# Patient Record
Sex: Female | Born: 1975
Health system: Southern US, Community
[De-identification: ages and names within clinical notes are randomized; demographics above are authoritative.]

## PROBLEM LIST (undated history)

## (undated) ENCOUNTER — Inpatient Hospital Stay (HOSPITAL_COMMUNITY): Payer: Self-pay

## (undated) DIAGNOSIS — O09899 Supervision of other high risk pregnancies, unspecified trimester: Secondary | ICD-10-CM

## (undated) DIAGNOSIS — O24419 Gestational diabetes mellitus in pregnancy, unspecified control: Secondary | ICD-10-CM

## (undated) DIAGNOSIS — Z86718 Personal history of other venous thrombosis and embolism: Principal | ICD-10-CM

## (undated) DIAGNOSIS — D649 Anemia, unspecified: Secondary | ICD-10-CM

## (undated) HISTORY — DX: Gestational diabetes mellitus in pregnancy, unspecified control: O24.419

## (undated) HISTORY — DX: Supervision of other high risk pregnancies, unspecified trimester: O09.899

## (undated) HISTORY — PX: SALPINGOSTOMY: SHX2372

## (undated) HISTORY — DX: Personal history of other venous thrombosis and embolism: Z86.718

---

## 2010-05-12 NOTE — L&D Delivery Note (Signed)
Delivery Note At 6:59 AM a viable female was delivered via Vaginal, Spontaneous Delivery (Presentation: occiput anterior).  APGAR: 9, 9; weight 6 lb 0.3 oz (2730 g).   Placenta status: Intact, Spontaneous.  Cord: 3 vessels with the following complications: Short.    Anesthesia: None  Episiotomy: None Lacerations: None Est. Blood Loss (mL):  Mom to postpartum.  Baby to nursery-stable.  STINSON, JACOB JEHIEL 05/12/2011, 7:30 AM

## 2010-12-22 LAB — RUBELLA ANTIBODY, IGM: Rubella: IMMUNE

## 2010-12-23 LAB — ABO/RH: RH Type: POSITIVE

## 2010-12-23 LAB — ANTIBODY SCREEN: Antibody Screen: NEGATIVE

## 2011-01-03 LAB — US OB DETAIL + 14 WK

## 2011-02-10 ENCOUNTER — Telehealth: Payer: Self-pay | Admitting: Family Medicine

## 2011-02-10 ENCOUNTER — Ambulatory Visit (INDEPENDENT_AMBULATORY_CARE_PROVIDER_SITE_OTHER): Payer: Self-pay | Admitting: Family Medicine

## 2011-02-10 ENCOUNTER — Other Ambulatory Visit: Payer: Self-pay | Admitting: Obstetrics & Gynecology

## 2011-02-10 VITALS — BP 114/72 | Temp 97.1°F | Ht 62.0 in | Wt 137.1 lb

## 2011-02-10 DIAGNOSIS — O0990 Supervision of high risk pregnancy, unspecified, unspecified trimester: Secondary | ICD-10-CM

## 2011-02-10 DIAGNOSIS — Z86718 Personal history of other venous thrombosis and embolism: Secondary | ICD-10-CM

## 2011-02-10 DIAGNOSIS — O09299 Supervision of pregnancy with other poor reproductive or obstetric history, unspecified trimester: Secondary | ICD-10-CM

## 2011-02-10 DIAGNOSIS — O09529 Supervision of elderly multigravida, unspecified trimester: Secondary | ICD-10-CM

## 2011-02-10 DIAGNOSIS — O0940 Supervision of pregnancy with grand multiparity, unspecified trimester: Secondary | ICD-10-CM

## 2011-02-10 LAB — CBC
MCH: 32.1 pg (ref 26.0–34.0)
Platelets: 291 10*3/uL (ref 150–400)
RBC: 3.61 MIL/uL — ABNORMAL LOW (ref 3.87–5.11)
RDW: 15 % (ref 11.5–15.5)
WBC: 9 10*3/uL (ref 4.0–10.5)

## 2011-02-10 NOTE — Progress Notes (Signed)
Subjective:    Jimia Gentles is a 35 y.o. female being seen today for her obstetrical visit. She is at [redacted]w[redacted]d gestation. Patient reports no complaints. Fetal movement: normal. Patient was referred from [facility], where she initiated her prenatal care for this pregnancy due to financial concerns; she has no intention of adopting out her baby. She has a history of DVT of her LT LE with her most recent pregnancy 4 years ago. She received her lovenox from [facility] for that pregnancy; it was diagnosed at 3 months gestation approximately; she didn't know she was pregnant until she got the DVT and sought medical care for a swollen leg. She had many blood tests and a pelvic exam at the [facility] clinic. She was told her pap and pelvic exam, as well as her other labs were normal. She feels like she has a slight cold today and wishes to wait until next visit to get her Flu vaccine.   Menstrual History: OB History    Grav Para Term Preterm Abortions TAB SAB Ect Mult Living   6 5 5  0 0 0 0 0 0 5      No LMP recorded. Patient is pregnant.    Objective:    BP 114/72  Temp 97.1 F (36.2 C)  Ht 5\' 2"  (1.575 m)  Wt 62.188 kg (137 lb 1.6 oz)  BMI 25.08 kg/m2 FHT: 136 BPM  Uterine Size: size equals dates at 26 cm     Assessment:    Pregnancy 26 and 5/7 weeks  History of DVT with pregnancy, has been off Lovenox x 2-3 weeks, needs to restart this  Plan:  Glucola, CBC, RPR and hemoglobin electrophoresis today ROI signed today and will get records from [facility] Contacted and left a message for Cole regarding need to assist patient for Medicaid, if eligible.  I have contacted Care Management at [facility]. Patient is not eligible for Lovenox financial assistance program because she does not have a Tree surgeon number. She is unlikely to qualify for pregnancy medicaid that would cover the expense for an outpatient medication. To get heparin, a cheaper option as a medication, she would have to also  purchase syringes and needles, and would have to heparin levels drawn approximately each week, and her dose would increase as her pregnancy progresses; she may need TID dosing to reach an adequate level (I spoke to clinical pharmacist at [facility] regarding this). Heparin is on national backorder, and I was not able to locate a pharmacy that could provide it; [facility] has it but states it would cost $228.00 for a box of 25 vials that would each provide 8 doses of 5000 units; her minimal dose would be 5000 units BID. This does not include the cost for the syringes and needles. Tresa Endo Rassett spoke to risk management, and patient would likely qualify for assistance through [facility] that would "write off" the cost of the labs to monitor the heparin level. The patient still has concerns about the cost of the medication, lovenox or heparin (see phone call encounter). The patient voices understanding of the risks to her and the pregnancy without this medication. She is willing to do what is needed to get this medication but cannot afford it. She is very thankful for our efforts. Clinical staff are assisting with research with the patient's records from [facility] and speaking to [facility] about any programs the patient might qualify for to get the medication paid for.  Follow up in 3 weeks for routine prenatal care otherwise.

## 2011-02-10 NOTE — Telephone Encounter (Signed)
Made a phone call to the patient with Interpretor Albertina Senegal. Pt was seen by me earlier in clinic today (see note). Pt does not qualify for Lovenox financial assistance b/c she does not have a SSN and this is required for assistance. She states she had received medications through [facility] during her pregnancy 4 years ago when she had the clot. She has gotten some medication through them and was told she could get one more refill, but has not went to get it. She filled some paperwork out with a friend who helped interpret at [facility] for assistance, but is not sure what it was for. She thinks she should be able to the medication for $4 after she gets her medicaid. She nor her husband have a job at this time. She voices understanding about the danger of not taking lovenox or heparin during her pregnancy and she would take it but cannot afford it. I am having clinical staff research possible other programs that might be available to her through [place] or [facility]. We will contact her and let her know if anything we can refer her to. Pt states she has a refill available for the lovenox, she will attempt to go to [facility] to get it, but she is unsure because of the travel costs.

## 2011-02-10 NOTE — Progress Notes (Signed)
Pt had PNC at [facility] Dept, ROI sent. She had labs and hollisters and pap. Pt is supposed to be taking Lovenox but has not taken it in 2 weeks. Lovenox prescribed at [facility], ROI signed and faxed. She says that she takes Lovenox for clots, in her last pregnany she had a clot.  Pt states that she feels very fatigued. Pt does not want to do flu shot today not feeling well. Will ask again at next visit.  1hr gtt today due at 1120

## 2011-02-10 NOTE — Patient Instructions (Signed)
Embarazo - Segundo trimestre (Pregnancy - Second Trimester) El segundo trimestre del embarazo (del 3 al 6mes) es un perodo de evolucin rpida para usted y el beb. Hacia el final del sexto mes, el beb mide aproximadamente 23 cm y pesa 680 g. Comenzar a sentir los movimientos del beb entre las 18 y las 20 semanas de embarazo. Podr sentir las pataditas ("quickening en ingls"). Hay un rpido aumento de peso. Puede segregar un lquido claro (calostro) de las mamas. Quizs sienta pequeas contracciones en el vientre (tero) Esto se conoce como falso trabajo de parto o contracciones de Braxton-Hicks. Es como una prctica del trabajo de parto que se produce cuando el beb est listo para salir. Generalmente los problemas de vmitos matinales ya se han superado hacia el final del primer trimestre. Algunas mujeres desarrollan pequeas manchas oscuras (que se denominan cloasma, mscara del embarazo) en la cara que normalmente se van luego del nacimiento del beb. La exposicin al sol empeora las manchas. Puede desarrollarse acn en algunas mujeres embarazadas, y puede desaparecer en aquellas que ya tienen acn. EXAMENES PRENATALES  Durante los exmenes prenatales, deber seguir realizando pruebas de sangre, segn avance el embarazo. Estas pruebas se realizan para controlar su salud y la del beb. Tambin se realizan anlisis de sangre para conocer los niveles de hemoglobina. La anemia (bajo nivel de hemoglobina) es frecuente durante el embarazo. Para prevenirla, se administran hierro y vitaminas. Tambin se le realizarn exmenes para saber si tiene diabetes entre las 24 y las 28 semanas del embarazo. Podrn repetirle algunas de las pruebas que le hicieron previamente.  En cada visita le medirn el tamao del tero. Esto se realiza para asegurarse de que el beb est creciendo correctamente de acuerdo al estado del embarazo.  Tambin en cada visita prenatal controlarn su presin arterial. Esto se realiza  para asegurarse de que no tenga toxemia.  Se controlar su orina para asegurarse de que no tenga infecciones, diabetes o protena en la orina.  Se controlar su peso regularmente para asegurarse que el aumento ocurre al ritmo indicado. Esto se hace para asegurarse que usted y el beb tienen una evolucin normal.  En algunas ocasiones se realiza una prueba de ultrasonido para confirmar el correcto desarrollo y evolucin del beb. Esta prueba se realiza con ondas sonoras inofensivas para el beb, de modo que el profesional pueda calcular ms precisamente la fecha del parto. Algunas veces se realizan pruebas especializadas del lquido amnitico que rodea al beb. Esta prueba se denomina amniocentesis. El lquido amnitico se obtiene introduciendo una aguja en el vientre (abdomen). Se realiza para controlar los cromosomas en aquellos casos en los que existe alguna preocupacin acerca de algn problema gentico que pueda sufrir el beb. En ocasiones se lleva a cabo cerca del final del embarazo, si es necesario inducir al parto. En este caso se realiza para asegurarse que los pulmones del beb estn lo suficientemente maduros como para que pueda vivir fuera del tero. CAMBIOS QUE OCURREN EN EL SEGUNDO TRIMESTRE DEL EMBARAZO Su organismo atravesar numerosos cambios durante el embarazo. Estos pueden variar de una persona a otra. Converse con el profesional que la asiste acerca los cambios que usted note y que la preocupen.  Durante el segundo trimestre probablemente sienta un aumento del apetito. Es normal tener "antojos" de ciertas comidas. Esto vara de una persona a otra y de un embarazo a otro.  El abdomen inferior comenzar a abultarse.  Podr tener la necesidad de orinar con ms frecuencia debido a que   el tero y el beb presionan sobre la vejiga. Tambin es frecuente contraer ms infecciones urinarias durante el embarazo (dolor al orinar). Puede evitarlas bebiendo gran cantidad de lquidos y vaciando  la vejiga antes y despus de mantener relaciones sexuales.  Podrn aparecer las primeras estras en las caderas, abdomen y mamas. Estos son cambios normales del cuerpo durante el embarazo. No existen medicamentos ni ejercicios que puedan prevenir estos cambios.  Es posible que comience a desarrollar venas inflamadas y abultadas (varices) en las piernas. El uso de medias de descanso, elevar sus pies durante 15 minutos, 3 a 4 veces al da y limitar la sal en su dieta ayuda a aliviar el problema.  Podr sentir acidez gstrica a medida que el tero crece y presiona contra el estmago. Puede tomar anticidos, con la autorizacin de su mdico, para aliviar este problema. Tambin es til ingerir pequeas comidas 4 a 5 veces al da.  La constipacin puede tratarse con un laxante o agregando fibra a su dieta. Beber grandes cantidades de lquidos, comer vegetales, frutas y granos integrales es de gran ayuda.  Tambin es beneficioso practicar actividad fsica. Si ha sido una persona activa hasta el embarazo, podr continuar con la mayora de las actividades durante el mismo. Si ha sido menos activa, puede ser beneficioso que comience con un programa de ejercicios, como realizar caminatas.  Puede desarrollar hemorroides (vrices en el recto) hacia el final del segundo trimestre. Tomar baos de asiento tibios y utilizar cremas recomendadas por el profesional que lo asiste sern de ayuda para los problemas de hemorroides.  Tambin podr sentir dolor de espalda durante este momento de su embarazo. Evite levantar objetos pesados, utilice zapatos de taco bajo y mantenga una buena postura para ayudar a reducir los problemas de espalda.  Algunas mujeres embarazadas desarrollan hormigueo y adormecimiento de la mano y los dedos debido a la hinchazn y compresin de los ligamentos de la mueca (sndrome del tnel carpiano). Esto desaparece una vez que el beb nace.  Como sus pechos se agrandan, necesitar un sujetador  ms grande. Use un sostn de soporte, cmodo y de algodn. No utilice un sostn para amamantar hasta el ltimo mes de embarazo si va a amamantar al beb.  Podr observar una lnea oscura desde el ombligo hacia la zona pbica denominada linea nigra.  Podr observar que sus mejillas se ponen coloradas debido al aumento de flujo sanguneo en la cara.  Podr desarrollar "araitas" en la cara, cuello y pecho. Esto desaparece una vez que el beb nace. INSTRUCCIONES PARA EL CUIDADO DOMICILIARIO  Es extremadamente importante que evite el cigarrillo, hierbas medicinales, alcohol y las drogas no prescriptas durante el embarazo. Estas sustancias qumicas afectan la formacin y el desarrollo del beb. Evite estas sustancias durante todo el embarazo para asegurar el nacimiento de un beb sano.  La mayor parte de los cuidados que se aconsejan son los mismos que los indicados para el primer trimestre del embarazo. Cumpla con las citas tal como se le indic. Siga las instrucciones del profesional que lo asiste con respecto al uso de los medicamentos, el ejercicio y la dieta.  Durante el embarazo debe obtener nutrientes para usted y para su beb. Consuma alimentos balanceados a intervalos regulares. Elija alimentos como carne, pescado, leche y otros productos lcteos descremados, vegetales, frutas, panes integrales y cereales. El profesional le informar cul es el aumento de peso ideal.  Las relaciones sexuales fsicas pueden continuarse hasta cerca del fin del embarazo si no existen otros problemas. Estos   problemas pueden ser la prdida temprana (prematura) de lquido amnitico de las membranas, sangrado vaginal, dolor abdominal u otros problemas mdicos o del embarazo.  Realice actividad fsica todos los das, si no tiene restricciones. Consulte con el profesional que la asiste si no sabe con certeza si determinados ejercicios son seguros. El mayor aumento de peso tiene lugar durante los ltimos 2 trimestres del  embarazo. El ejercicio la ayudar a:  Controlar su peso.  Ponerla en forma para el parto.  Ayudarla a perder peso luego de haber dado a luz.  Use un buen sostn o como los que se usan para hacer deportes para aliviar la sensibilidad de las mamas. Tambin puede serle til si lo usa mientras duerme. Si pierde calostro, podr utilizar apsitos en el sostn.  No utilice la baera con agua caliente, baos turcos y saunas durante el embarazo.  Utilice el cinturn de seguridad sin excepcin cuando conduzca. Este la proteger a usted y al beb en caso de accidente.  Evite comer carne cruda, queso crudo, y el contacto con los utensilios y desperdicios de los gatos. Estos elementos contienen grmenes que pueden causar defectos de nacimiento en el beb.  El segundo trimestre es un buen momento para visitar a su dentista y evaluar su salud dental si an no lo ha hecho. Es importante mantener los dientes limpios. Utilice un cepillo de dientes blando. Cepllese ms suavemente durante el embarazo.  Es ms fcil perder algo de orina durante el embarazo. Apretar y fortalecer los msculos de la pelvis la ayudar con este problema. Practique detener la miccin cuando est en el bao. Estos son los mismos msculos que necesita fortalecer. Son tambin los mismos msculos que utiliza cuando trata de evitar los gases. Puede practicar apretando estos msculos 10 veces, y repetir esto 3 veces por da aproximadamente. Una vez que conozca qu msculos debe apretar, no realice estos ejercicios durante la miccin. Puede favorecerle una infeccin si la orina vuelve hacia atrs.  Pida ayuda si tiene necesidades econmicas, de asesoramiento o nutricionales durante el embarazo. El profesional podr ayudarla con respecto a estas necesidades, o derivarla a otros especialistas.  La piel puede ponerse grasa. Si esto sucede, lvese la cara con un jabn suave, utilice un humectante no graso y maquillaje con base de aceite o  crema. CONSUMO DE MEDICAMENTOS Y DROGAS DURANTE EL EMBARAZO  Contine tomando las vitaminas apropiadas para esta etapa tal como se le indic. Las vitaminas deben contener un miligramo de cido flico y deben suplementarse con hierro. Guarde todas las vitaminas fuera del alcance de los nios. La ingestin de slo un par de vitaminas o tabletas que contengan hierro puede ocasionar la muerte en un beb o en un nio pequeo.  Evite el uso de medicamentos, inclusive los de venta libre y hierbas que no hayan sido prescriptos o indicados por el profesional que la asiste. Algunos medicamentos pueden causar problemas fsicos al beb. Utilice los medicamentos de venta libre o de prescripcin para el dolor, el malestar o la fiebre, segn se lo indique el profesional que lo asiste. No utilice aspirina.  El consumo de alcohol est relacionado con ciertos defectos de nacimiento. Esto incluye el sndrome de alcoholismo fetal. Debe evitar el consumo de alcohol en cualquiera de sus formas. El cigarrillo causa nacimientos prematuros y bebs de bajo peso. El uso de drogas recreativas est absolutamente prohibido. Son muy nocivas para el beb. Un beb que nace de una madre adicta, ser adicto al nacer. Ese beb tendr los mismos   causa nacimientos prematuros y bebs de [place]. El uso de drogas recreativas est absolutamente prohibido. Son muy nocivas para el beb. Un beb que nace de [employer], ser adicto al nacer. Ese beb tendr los mismos sntomas de abstinencia que un adulto.   Infrmele al profesional si consume alguna droga.   No consuma drogas ilegales. Pueden causarle mucho dao al beb.  SOLICITE ATENCIN MDICA SI:  Tiene preguntas o preocupaciones durante su embarazo. Es mejor que llame para Science writer las dudas que esperar hasta su prxima visita prenatal. Thressa Sheller forma se sentir ms tranquila.  SOLICITE ATENCIN MDICA DE INMEDIATO SI:  La temperatura oral se eleva sin motivo por encima de 100.4 F o segn le indique el profesional que lo asiste.   Tiene una prdida de lquido por la vagina (canal de parto). Si sospecha una ruptura de las [place], tmese la temperatura y  llame al profesional para informarlo sobre esto.   Observa unas pequeas manchas, una hemorragia vaginal o elimina cogulos. Notifique al profesional acerca de la cantidad y de cuntos apsitos est utilizando. Unas pequeas manchas de sangre son algo comn durante el Psychiatrist, especialmente despus de Sales promotion account executive.   Presenta un olor desagradable en la secrecin vaginal y observa un cambio en el color, de transparente a blanco.   Contina con las nuseas y no obtiene alivio de los remedios indicados. Vomita sangre o algo similar a la borra del caf.   [place] o sube ms de 900 g. en una semana, o segn lo indicado por el profesional que la asiste.   Observa que se le [employer], las manos, los pies o las piernas.   Ha estado expuesta a la rubola y no ha sufrido la enfermedad.   Ha estado expuesta a la quinta enfermedad o a la varicela.   Presenta dolor abdominal. Las molestias en el ligamento redondo son Neomia Dear causa no cancerosa (benigna) frecuente de dolor abdominal durante el embarazo. El profesional que la asiste deber evaluarla.   Presenta dolor de cabeza intenso que no se [place].   Presenta fiebre, diarrea, dolor al orinar o le falta la respiracin.   Presenta dificultad para ver, visin borrosa, o visin doble.   Sufre una cada, un accidente de trnsito o cualquier tipo de trauma.   Vive en un hogar en el que existe violencia fsica o mental.  Document Released: 02/05/2005 Document Re-Released: 07/23/2009 [facility] Patient Information 2011 [place], [place].

## 2011-02-11 ENCOUNTER — Telehealth: Payer: Self-pay | Admitting: *Deleted

## 2011-02-11 LAB — RPR

## 2011-02-11 NOTE — Telephone Encounter (Signed)
Telephoned Maria Cook spoke with Maria Cook at [phone] she said to call [phone] and speak with a nurse about pts Lovenox. Telephoned [phone] left message to return call to clinic. Maria Cook returned call to clinic. Maria Cook advised Dr. Ace Gins in MFM had seen pt previously in their clinic and given pt RX for Lovenox and pt needed to fill out papers for charity care. First rx will be at a discounted rate that pt can afford. Pt will need to bring bank statements and any proof of income. Maria Cook has already called pharmacy and ordered medication. Will advise pt of this. Pt does not need social security number for forms and does need forms filled out in order to qualify for assistance. If patient has any questions she can call [phone]. There are interpreters available.

## 2011-02-12 LAB — HEMOGLOBINOPATHY EVALUATION: Hgb F Quant: 0.7 % (ref 0.0–2.0)

## 2011-02-12 NOTE — Telephone Encounter (Signed)
Telephoned pt at [phone] used interpreter Delorise Royals. Advised pt she could pick Lovenox up at [facility] Rx would be waiting on her. She would need to take records with her to verify income. When she receives med would need to make appointment with [facility]. Gave pt number to [facility] if any questions [phone]. Pt voiced understanding.

## 2011-02-14 NOTE — Progress Notes (Signed)
Pt came to office today with Lovenox states was approved for a years worth of medication pharmacy phone number is [phone]. Advised pt of failed 1 hr GTT and needs to come in [facility] Oct 8th for 3 hr GTT. Pt will be here around 9 am after gets children on bus. Pt voiced understanding of results and importance of test.

## 2011-02-14 NOTE — Telephone Encounter (Signed)
Pt has picked up Lovenox injections and came to clinic today. Advised pt of failed 1 hr GTT and pt will come in Oct 8th for 3 hr GTT. Used interpreter Lawanna Kobus. [facility] pharmacy [phone]

## 2011-02-19 ENCOUNTER — Other Ambulatory Visit: Payer: Self-pay

## 2011-02-19 DIAGNOSIS — R7309 Other abnormal glucose: Secondary | ICD-10-CM

## 2011-02-20 LAB — GLUCOSE TOLERANCE, 3 HOURS
Glucose Tolerance, 1 hour: 170 mg/dL (ref 70–189)
Glucose Tolerance, Fasting: 65 mg/dL — ABNORMAL LOW (ref 70–104)
Glucose, GTT - 3 Hour: 153 mg/dL — ABNORMAL HIGH (ref 70–144)

## 2011-02-21 ENCOUNTER — Telehealth: Payer: Self-pay | Admitting: *Deleted

## 2011-02-21 NOTE — Telephone Encounter (Signed)
I called pt with Maria Cook- interpreter. I informed pt of abnormal 3hr blood sugar test. I explained that we need her to come for appt 02/24/11 @ 0830 for Diabetes education and MD visit. Pt voiced understanding and agreed to appt.

## 2011-02-21 NOTE — Telephone Encounter (Signed)
Message copied by Jill Side on Fri Feb 21, 2011 11:09 AM ------      Message from: Delena Bali      Created: Thu Feb 20, 2011  2:01 PM       Please ensure patient has an appointment for Monday to be seen in the Diabetes [facility] because she did not pass her 3-hour GTT. Thanks!

## 2011-02-24 ENCOUNTER — Encounter: Payer: Self-pay | Admitting: Obstetrics and Gynecology

## 2011-02-24 ENCOUNTER — Other Ambulatory Visit: Payer: Self-pay | Admitting: Obstetrics and Gynecology

## 2011-02-24 ENCOUNTER — Ambulatory Visit (INDEPENDENT_AMBULATORY_CARE_PROVIDER_SITE_OTHER): Payer: Self-pay | Admitting: *Deleted

## 2011-02-24 ENCOUNTER — Encounter: Payer: Self-pay | Attending: Obstetrics & Gynecology | Admitting: Dietician

## 2011-02-24 VITALS — BP 107/69 | Temp 97.0°F | Wt 139.6 lb

## 2011-02-24 DIAGNOSIS — O09899 Supervision of other high risk pregnancies, unspecified trimester: Secondary | ICD-10-CM

## 2011-02-24 DIAGNOSIS — O0990 Supervision of high risk pregnancy, unspecified, unspecified trimester: Secondary | ICD-10-CM

## 2011-02-24 DIAGNOSIS — O99810 Abnormal glucose complicating pregnancy: Secondary | ICD-10-CM | POA: Insufficient documentation

## 2011-02-24 DIAGNOSIS — Z713 Dietary counseling and surveillance: Secondary | ICD-10-CM | POA: Insufficient documentation

## 2011-02-24 DIAGNOSIS — O24419 Gestational diabetes mellitus in pregnancy, unspecified control: Secondary | ICD-10-CM

## 2011-02-24 HISTORY — DX: Supervision of other high risk pregnancies, unspecified trimester: O09.899

## 2011-02-24 HISTORY — DX: Gestational diabetes mellitus in pregnancy, unspecified control: O24.419

## 2011-02-24 MED ORDER — ENOXAPARIN SODIUM 150 MG/ML ~~LOC~~ SOLN: 40.0000 mg | Freq: Two times a day (BID) | SUBCUTANEOUS | Status: DC

## 2011-02-24 NOTE — Progress Notes (Signed)
Used Interpreter Raynelle Fanning Feels a lot of pain and pressure in her pelvis, especially when walking.

## 2011-02-24 NOTE — Patient Instructions (Signed)
Diabetes mellitus gestacional (DMG) (Gestational Diabetes Mellitus, GDM) La diabetes mellitus gestacional se produce slo durante el embarazo. Aparece cuando el organismo no puede controlar adecuadamente la glucosa (azcar) que aumenta en la sangre despus de comer. Durante el embarazo, se produce una resistencia a la insulina (sensibilidad reducida a la insulina) debido a la liberacin de hormonas por parte de la placenta. Generalmente, el pncreas de una mujer embarazada produce la cantidad suficiente de insulina para vencer esa resistencia. Sin embargo, en la diabetes gestacional, hay insulina pero no cumple su funcin adecuadamente. Si la resistencia es lo suficientemente grave como para que el pncreas no produzca la cantidad de insulina suficiente, la glucosa extra se acumula en la sangre.  QUINES TIENEN RIESGO DE DESARROLLAR DIABETES GESTACIONAL?  Las mujeres con historia de diabetes en la familia.   Las mujeres de ms de 25 aos.   Las que presentan sobrepeso.   Las mujeres que pertenecen a ciertos grupos tnicos (latinas, afroamericanas, norteamericanas nativas, asiticas y las originarias de las islas del [place].  QUE PUEDE OCURRIRLE AL BEB? Si el nivel de glucosa en sangre de la madre es demasiado elevado mientras este embarazada, el nivel extra de azcar pasar por el cordn umbilical hacia el beb. Algunos de los problemas del beb pueden ser:  Beb demasiado grande: si el nio recibe demasiada azcar, puede aumentar mucho de peso. Esto puede hacer que sea demasiado grande para nacer por parto normal (vaginal) por lo que ser necesario realizar una cesrea.   Bajo nivel de glucosa (hipoglucemia): el beb produce insulina extra en respuesta a la excesiva cantidad de azcar que obtiene de la madre. Cuando el beb nace y ya no necesita insulina extra, su nivel de azcar en sangre puede disminuir.   Ictericia (coloracin amarillenta de la piel y los ojos): esto es bastante  frecuente en los bebs. La causa es la acumulacin de una sustancia qumica denominada bilirrubina. No siempre es un trastorno grave, pero se observa con frecuencia en los bebs cuyas madres sufren diabetes gestacional.  RIESGOS PARA LA MADRE Las mujeres que han sufrido diabetes gestacional pueden tener ms riesgos para algunos problemas como:  Preeclampsia o toxemia, incluyendo problemas con hipertensin arterial. La presin arterial y los niveles de protenas en la orina deben controlarse con frecuencia.   Infecciones   Parto por cesrea.   Aparicin de diabetes tipo 2 en una etapa posterior de la vida. Alrededor del 30% al 50% sufrir diabetes posteriormente, especialmente las que son obesas.  DIAGNSTICO Las hormonas que causan resistencia a la insulina tienen su mayor nivel alrededor de las 24 a 28 semanas del embarazo. Si se experimentan sntomas, stos son similares a los sntomas que normalmente aparecen durante el embarazo.  La diabetes mellitus gestacional generalmente se diagnostica por medio de un mtodo en dos partes: 1. Despus de la 24 a 28 semanas de embarazo, la mujer debe beber una solucin que contiene glucosa y realizar un anlisis de sangre. Si el nivel de glucosa es elevado, la realizarn un segundo anlisis.  2. La prueba oral de tolerancia a la glucosa, que dura aproximadamente tres horas. Despus de realizar ayuno durante la noche, se controla nivel de glucosa en sangre. La mujer bebe una solucin que contiene glucosa y le realizan anlisis de glucosa en sangre cada hora.  Si la mujer tiene factores de riesgos para la diabetes mellitus gestacional, el mdico podr indicar el anlisis antes de las 24 semanas de embarazo. TRATAMIENTO El tratamiento est dirigido a mantener la   glucosa en sangre de la madre en un nivel normal y puede incluir:  La planificacin de los alimentos.   Recibir insulina u otro medicamento para controlar el nivel de glucosa en sangre.   La  prctica de ejercicios.   Llevar un registro diario de los alimentos que consume.   Control y registro de los niveles de glucosa en sangre.   Control de los niveles de cetona en la orina, aunque esto ya no se considera necesario en la mayora de los embarazos.  INSTRUCCIONES PARA EL CUIDADO DOMICILIARIO Mientras est embarazada:  Siga los consejos de su mdico relacionados con los controles prenatales, la planificacin de la comida, la actividad fsica, los medicamentos, vitaminas, los anlisis de sangre y otras pruebas y las actividades fsicas.   Lleve un registro de las comidas, las pruebas de glucosa en sangre y la cantidad de insulina que recibe (si corresponde). Muestre todo al profesional en cada consulta mdica prenatal.   Si sufre diabetes mellitus gestacional, podr tener problemas de hipoglucemia (nivel bajo de glucosa en sangre). Podr sospechar este problema si se siente repentinamente mareada, tiene temblores y/o se siente dbil. Si cree que esto le est ocurriendo, y tiene un medidor de glucosa, mida su nivel de glucosa en sangre. Siga los consejos de su mdico sobre el modo y el momento de tratar su nivel de glucosa en sangre. Generalmente se sigue la regla 15:15 Consuma 15 g de hidratos de carbono, espere 15 minutos y vuelva controlar el nivel de glucosa en sangre.. Ejemplos de 15 g de hidratos de carbono son:   1 taza de leche descremada.    taza de jugo.   3-4 tabletas de glucosa.   5-6 caramelos duros.   1 caja pequea de pasas de uva.    taza de gaseosa comn.   Mantenga una buena higiene para evitar infecciones.   No fume.  SOLICITE ATENCIN MDICA SI:  Observa prdida vaginal con o sin picazn.   Se siente ms dbil o cansada que lo habitual.   Transpira mucho.   Tiene un aumento de peso repentino, 2,5 kg o ms en una semana.   Pierde peso, 1.5 kg o ms en una semana.   Su nivel de glucosa en sangre es elevado, necesita instrucciones.  SOLICITE  ATENCIN MDICA DE INMEDIATO SI:  Sufre una cefalea intensa.   Se marea o pierde el conocimiento   Presenta nuseas o vmitos.   Se siente desorientada confundida.   Sufre convulsiones.   Tiene problemas de visin.   Siente dolor en el estmago.   Presenta una hemorragia vaginal abundante.   Tiene contracciones uterinas.   Tiene una prdida importante de lquido por la vagina  DESPUS QUE NACE EL BEB:  Concurra a todos los controles de seguimiento y realice los anlisis de sangre segn las indicaciones de su mdico.   Mantenga un estilo de vida saludable para evitar la diabetes en el futuro. Aqu se incluye:   Siga el plan de alimentacin saludable.   Controle su peso.   Practique actividad fsica y descanse lo necesario.   No fume.   Amamante a su beb mientras pueda. Esto disminuir la probabilidad de que usted y su beb sufran diabetes posteriormente.  Para ms informacin acerca de la diabetes, visite la pgina web de la American Diabetes Association: www.americandiabetesassociation.org. Para ms informacin acerca de la diabetes gestacional cite la pgina web del American Congress of Obstetricians and Gynecologists en: www.acog.org. Document Released: 02/05/2005 Document Re-Released: 07/23/2009 [employer] Patient   Information 2011 [employer]. 

## 2011-02-24 NOTE — Progress Notes (Signed)
Pt with hx of DVT in pregnancy 2008. ROI for records due to probable underlining coagulopathy per pt report. Referral to MFM scheduled and [place] for growth. Maggie today to complete GDM teaching. FU 1 week to review BS

## 2011-02-24 NOTE — Progress Notes (Signed)
02/24/2011 Diabetes Education:  Saw briefly prior to MD appointment for meter/monitoring instructions.  Provided True Track Meter HQI:[id number] and Expiration 2011/08/30  Strips UXL:[id number] Expiration 2012/09/08.  Lancets Lot: [id number] Expiration: 2014/06/14.  Completed return demonstration for procedure.  Glucose past morning meal of milk was 80 mg.  Will complete diet education past MD visit.  Maggie Martavius Lusty, RN, RD, CDE.  Saw with Raynelle Fanning the Spanish interpreter, completed the GDM diet review and provided handouts in Spanish:  Nutrition, Diabetes and Pregnancy and Corteo de carbos y planificacion de cp,odas.  Raynelle Fanning also assisted with the meter instruction.  Maggie Aurore Redinger, RN, RD, CDE.

## 2011-02-28 ENCOUNTER — Ambulatory Visit (HOSPITAL_COMMUNITY): Payer: Self-pay

## 2011-03-03 ENCOUNTER — Encounter: Payer: Self-pay | Admitting: Obstetrics & Gynecology

## 2011-03-03 ENCOUNTER — Ambulatory Visit (HOSPITAL_COMMUNITY)
Admission: RE | Admit: 2011-03-03 | Discharge: 2011-03-03 | Disposition: A | Discharge status: Home / Self Care | Payer: Self-pay | Source: Ambulatory Visit | Attending: Physician Assistant | Admitting: Physician Assistant

## 2011-03-03 ENCOUNTER — Ambulatory Visit (INDEPENDENT_AMBULATORY_CARE_PROVIDER_SITE_OTHER): Payer: Self-pay | Admitting: Obstetrics & Gynecology

## 2011-03-03 DIAGNOSIS — O09529 Supervision of elderly multigravida, unspecified trimester: Secondary | ICD-10-CM | POA: Insufficient documentation

## 2011-03-03 DIAGNOSIS — O358XX0 Maternal care for other (suspected) fetal abnormality and damage, not applicable or unspecified: Secondary | ICD-10-CM | POA: Insufficient documentation

## 2011-03-03 DIAGNOSIS — I82409 Acute embolism and thrombosis of unspecified deep veins of unspecified lower extremity: Secondary | ICD-10-CM | POA: Insufficient documentation

## 2011-03-03 DIAGNOSIS — Z363 Encounter for antenatal screening for malformations: Secondary | ICD-10-CM | POA: Insufficient documentation

## 2011-03-03 DIAGNOSIS — Z86718 Personal history of other venous thrombosis and embolism: Secondary | ICD-10-CM

## 2011-03-03 DIAGNOSIS — O0990 Supervision of high risk pregnancy, unspecified, unspecified trimester: Secondary | ICD-10-CM

## 2011-03-03 DIAGNOSIS — O24419 Gestational diabetes mellitus in pregnancy, unspecified control: Secondary | ICD-10-CM

## 2011-03-03 DIAGNOSIS — Z23 Encounter for immunization: Secondary | ICD-10-CM

## 2011-03-03 DIAGNOSIS — O09299 Supervision of pregnancy with other poor reproductive or obstetric history, unspecified trimester: Secondary | ICD-10-CM

## 2011-03-03 DIAGNOSIS — O99810 Abnormal glucose complicating pregnancy: Secondary | ICD-10-CM | POA: Insufficient documentation

## 2011-03-03 DIAGNOSIS — O0940 Supervision of pregnancy with grand multiparity, unspecified trimester: Secondary | ICD-10-CM

## 2011-03-03 DIAGNOSIS — Z1389 Encounter for screening for other disorder: Secondary | ICD-10-CM | POA: Insufficient documentation

## 2011-03-03 LAB — POCT URINALYSIS DIP (DEVICE)
Bilirubin Urine: NEGATIVE
Ketones, ur: NEGATIVE mg/dL
Leukocytes, UA: NEGATIVE
Specific Gravity, Urine: >=1.03 (ref 1.005–1.030)
pH: 7 (ref 5.0–8.0)

## 2011-03-03 MED ORDER — INFLUENZA VIRUS VACC SPLIT PF IM SUSP
0.5000 mL | Freq: Once | INTRAMUSCULAR | Status: AC
  Administered 2011-03-03: 0.5 mL via INTRAMUSCULAR

## 2011-03-03 NOTE — Progress Notes (Signed)
One fasting BS>100, rest 80-92.  Postprandials are mostly <120, except for lunch 130s-150s.  Patient admits to eating lots of rice in lunch.  Emphasized proper carb modifcation for optimal diet control to optimize maternal and fetal outcomes.  She will try to modify her diet; will revaluate in one week. If not change, may benefit from Glyburide qam (or more if needed). Plans to breastfeed and Mirena for [facility].  Will have u/s today. Patient to return to [facility] to get further Lovenox injections.  Flu shot to be given today.  Fetal movement and preterm labor precautions reviewed. RTC in one week for BS review.

## 2011-03-03 NOTE — Progress Notes (Signed)
Used Int: Maria Cook Has be unable to get her injections at [facility], because she doesn't have an rx for it. Has pelvic pain and pressure.

## 2011-03-06 ENCOUNTER — Other Ambulatory Visit: Payer: Self-pay | Admitting: *Deleted

## 2011-03-06 ENCOUNTER — Telehealth: Payer: Self-pay | Admitting: *Deleted

## 2011-03-06 NOTE — Telephone Encounter (Signed)
Have worked with Delorise Royals, Spanish Interpreter and Bennie Hind, Social Worker to arrange to get patient's Lovenox.  Patient has not ran completely out of Lovenox.  Raynelle Fanning was able to have a friend of hers drive the patient to [facility] to pick up her 1 month supply.  They will pick up the supply today.  I spoke with  Dr. Ace Gins from [facility] in [place].  Dr. Ace Gins wants the patient to stay at the 40mg  dose daily.  Dr. Macon Large agrees.  Dr. Ace Gins has prescribed the 40 mg per day dose to the [facility]  There pharmacy will not accept an order from our providers.  Dr. Ace Gins asks if we need to change the dosage in the future that our doctor contact her at [phone] and explain the rationale for the change and then after collaboration she will prescribe so the patient can receive at their pharmacy at minimal cost to the patient.

## 2011-03-10 ENCOUNTER — Encounter: Payer: Self-pay | Attending: Obstetrics & Gynecology | Admitting: Dietician

## 2011-03-10 ENCOUNTER — Ambulatory Visit (INDEPENDENT_AMBULATORY_CARE_PROVIDER_SITE_OTHER): Payer: Self-pay | Admitting: Family Medicine

## 2011-03-10 VITALS — BP 92/64 | Temp 96.6°F | Wt 138.0 lb

## 2011-03-10 DIAGNOSIS — O09299 Supervision of pregnancy with other poor reproductive or obstetric history, unspecified trimester: Secondary | ICD-10-CM

## 2011-03-10 DIAGNOSIS — O09899 Supervision of other high risk pregnancies, unspecified trimester: Secondary | ICD-10-CM

## 2011-03-10 DIAGNOSIS — O09529 Supervision of elderly multigravida, unspecified trimester: Secondary | ICD-10-CM

## 2011-03-10 DIAGNOSIS — O99810 Abnormal glucose complicating pregnancy: Secondary | ICD-10-CM | POA: Insufficient documentation

## 2011-03-10 DIAGNOSIS — Z86718 Personal history of other venous thrombosis and embolism: Secondary | ICD-10-CM

## 2011-03-10 DIAGNOSIS — Z713 Dietary counseling and surveillance: Secondary | ICD-10-CM | POA: Insufficient documentation

## 2011-03-10 DIAGNOSIS — O0940 Supervision of pregnancy with grand multiparity, unspecified trimester: Secondary | ICD-10-CM

## 2011-03-10 LAB — POCT URINALYSIS DIP (DEVICE)
Bilirubin Urine: NEGATIVE
Glucose, UA: NEGATIVE mg/dL
Hgb urine dipstick: NEGATIVE
Ketones, ur: NEGATIVE mg/dL
Leukocytes, UA: NEGATIVE
Nitrite: NEGATIVE
Protein, ur: NEGATIVE mg/dL
Specific Gravity, Urine: 1.02 (ref 1.005–1.030)
Urobilinogen, UA: 0.2 mg/dL (ref 0.0–1.0)
pH: 7 (ref 5.0–8.0)

## 2011-03-10 MED ORDER — PRENATAL VITAMINS PLUS 27-1 MG PO TABS: 1.0000 | ORAL_TABLET | Freq: Every day | ORAL | Status: DC

## 2011-03-10 NOTE — Patient Instructions (Signed)
Diabetes mellitus gestacional (Gestational Diabetes Mellitus) La diabetes mellitus gestacional se produce slo durante el embarazo. Aparece cuando el organismo no puede controlar adecuadamente la glucosa (azcar) que aumenta en la sangre despus de comer. Durante el embarazo, se produce una resistencia a la insulina (sensibilidad reducida a la insulina) debido a la liberacin de hormonas por parte de la placenta. Generalmente, el pncreas de una mujer embarazada produce la cantidad suficiente de insulina para vencer esa resistencia. Sin embargo, en la diabetes gestacional, hay insulina pero no cumple su funcin adecuadamente. Si la resistencia es lo suficientemente grave como para que el pncreas no produzca la cantidad de insulina suficiente, la glucosa extra se acumula en la sangre.  QUINES TIENEN RIESGO DE DESARROLLAR DIABETES GESTACIONAL?  Las mujeres con historia de diabetes en la familia.   Las mujeres de ms de 25 aos.   Las que presentan sobrepeso.   Las mujeres que pertenecen a ciertos grupos tnicos (latinas, afroamericanas, norteamericanas nativas, asiticas y las originarias de las islas del [place].  QUE PUEDE OCURRIRLE AL BEB? Si el nivel de glucosa en sangre de la madre es demasiado elevado mientras este embarazada, el nivel extra de azcar pasar por el cordn umbilical hacia el beb. Algunos de los problemas del beb pueden ser:  Beb demasiado grande: si el nio recibe demasiada azcar, puede aumentar mucho de peso. Esto puede hacer que sea demasiado grande para nacer por parto normal (vaginal) por lo que ser necesario realizar una cesrea.   Bajo nivel de glucosa (hipoglucemia): el beb produce insulina extra en respuesta a la excesiva cantidad de azcar que obtiene de la madre. Cuando el beb nace y ya no necesita insulina extra, su nivel de azcar en sangre puede disminuir.   Ictericia (coloracin amarillenta de la piel y los ojos): esto es bastante frecuente en los  bebs. La causa es la acumulacin de una sustancia qumica denominada bilirrubina. No siempre es un trastorno grave, pero se observa con frecuencia en los bebs cuyas madres sufren diabetes gestacional.  RIESGOS PARA LA MADRE Las mujeres que han sufrido diabetes gestacional pueden tener ms riesgos para algunos problemas como:  Preeclampsia o toxemia, incluyendo problemas con hipertensin arterial. La presin arterial y los niveles de protenas en la orina deben controlarse con frecuencia.   Infecciones   Parto por cesrea.   Aparicin de diabetes tipo 2 en una etapa posterior de la vida. Alrededor del 30% al 50% sufrir diabetes posteriormente, especialmente las que son obesas.  DIAGNSTICO Las hormonas que causan resistencia a la insulina tienen su mayor nivel alrededor de las 24 a 28 semanas del embarazo. Si se experimentan sntomas, stos son similares a los sntomas que normalmente aparecen durante el embarazo.  La diabetes mellitus gestacional generalmente se diagnostica por medio de un mtodo en dos partes: 1. Despus de la 24 a 28 semanas de embarazo, la mujer debe beber una solucin que contiene glucosa y realizar un anlisis de sangre. Si el nivel de glucosa es elevado, la realizarn un segundo anlisis.  2. La prueba oral de tolerancia a la glucosa, que dura aproximadamente tres horas. Despus de realizar ayuno durante la noche, se controla nivel de glucosa en sangre. La mujer bebe una solucin que contiene glucosa y le realizan anlisis de glucosa en sangre cada hora.  Si la mujer tiene factores de riesgos para la diabetes mellitus gestacional, el mdico podr indicar el anlisis antes de las 24 semanas de embarazo. TRATAMIENTO El tratamiento est dirigido a mantener la glucosa en   sangre de la madre en un nivel normal y puede incluir:  La planificacin de los alimentos.   Recibir insulina u otro medicamento para controlar el nivel de glucosa en sangre.   La prctica de ejercicios.    Llevar un registro diario de los alimentos que consume.   Control y registro de los niveles de glucosa en sangre.   Control de los niveles de cetona en la orina, aunque esto ya no se considera necesario en la mayora de los embarazos.  INSTRUCCIONES PARA EL CUIDADO DOMICILIARIO Mientras est embarazada:  Siga los consejos de su mdico relacionados con los controles prenatales, la planificacin de la comida, la actividad fsica, los medicamentos, vitaminas, los anlisis de sangre y otras pruebas y las actividades fsicas.   Lleve un registro de las comidas, las pruebas de glucosa en sangre y la cantidad de insulina que recibe (si corresponde). Muestre todo al profesional en cada consulta mdica prenatal.   Si sufre diabetes mellitus gestacional, podr tener problemas de hipoglucemia (nivel bajo de glucosa en sangre). Podr sospechar este problema si se siente repentinamente mareada, tiene temblores y/o se siente dbil. Si cree que esto le est ocurriendo, y tiene un medidor de glucosa, mida su nivel de glucosa en sangre. Siga los consejos de su mdico sobre el modo y el momento de tratar su nivel de glucosa en sangre. Generalmente se sigue la regla 15:15 Consuma 15 g de hidratos de carbono, espere 15 minutos y vuelva controlar el nivel de glucosa en sangre.. Ejemplos de 15 g de hidratos de carbono son:   1 taza de leche descremada.    taza de jugo.   3-4 tabletas de glucosa.   5-6 caramelos duros.   1 caja pequea de pasas de uva.    taza de gaseosa comn.   Mantenga una buena higiene para evitar infecciones.   No fume.  SOLICITE ATENCIN MDICA SI:  Observa prdida vaginal con o sin picazn.   Se siente ms dbil o cansada que lo habitual.   Transpira mucho.   Tiene un aumento de peso repentino, 2,5 kg o ms en una semana.   Pierde peso, 1.5 kg o ms en una semana.   Su nivel de glucosa en sangre es elevado, necesita instrucciones.  SOLICITE ATENCIN MDICA DE  INMEDIATO SI:  Sufre una cefalea intensa.   Se marea o pierde el conocimiento   Presenta nuseas o vmitos.   Se siente desorientada confundida.   Sufre convulsiones.   Tiene problemas de visin.   Siente dolor en el estmago.   Presenta una hemorragia vaginal abundante.   Tiene contracciones uterinas.   Tiene una prdida importante de lquido por la vagina  DESPUS QUE NACE EL BEB:  Concurra a todos los controles de seguimiento y realice los anlisis de sangre segn las indicaciones de su mdico.   Mantenga un estilo de vida saludable para evitar la diabetes en el futuro. Aqu se incluye:   Siga el plan de alimentacin saludable.   Controle su peso.   Practique actividad fsica y descanse lo necesario.   No fume.   Amamante a su beb mientras pueda. Esto disminuir la probabilidad de que usted y su beb sufran diabetes posteriormente.  Para ms informacin acerca de la diabetes, visite la pgina web de la American Diabetes Association: www.americandiabetesassociation.org. Para ms informacin acerca de la diabetes gestacional cite la pgina web del American [employer]nec[employer] en: www.acog.org. Document Released: 02/05/2005 Document Revised: 01/08/2011 [employer] Patient Information 2012   [employer]. Embarazo - Tercer trimestre (Pregnancy - Third Trimester) El tercer trimestre del embarazo (los ltimos 3 meses) es el perodo de cambios ms rpidos que atraviesan usted y el beb. El aumento de peso es ms rpido. El beb alcanza un largo de aproximadamente 50 cm (20 pulgadas) y pesa entre 2,700 y 4,500 kg (6 a 10 libras). El beb gana ms tejido graso y ya est listo para la vida fuera del cuerpo de la madre. Mientras estn en el interior, los bebs tienen perodos de sueo y vigilia, succionan el pulgar y tienen hipo. Quizs sienta pequeas contracciones del tero. Este es el falso trabajo de parto. Tambin se las conoce como contracciones de  Braxton-Hicks. Es como una prctica del parto. Los problemas ms habituales de esta etapa del embarazo incluyen mayor dificultad para respirar, hinchazn de las manos y los pies por retencin de lquidos y la necesidad de orinar con ms frecuencia debido a que el tero y el beb presionan sobre la vejiga.  EXAMENES PRENATALES  Durante los exmenes prenatales, deber seguir realizando pruebas de sangre, segn avance el embarazo. Estas pruebas se realizan para controlar su salud y la del beb. Tambin se realizan anlisis de sangre para conocer los niveles de hemoglobina. La anemia (bajo nivel de hemoglobina) es frecuente durante el embarazo. Para prevenirla, se administran hierro y vitaminas. Tambin le harn nuevas pruebas para descartar la diabetes. Podrn repetirle algunas de las pruebas que le hicieron previamente.   En cada visita le medirn el tamao del tero. Es para asegurarse de que el beb se desarrolla correctamente.   Tambin en cada visita la pesarn. Esto se realiza para asegurarse de que aumenta de peso al ritmo indicado y que usted y su beb evolucionan normalmente.   En algunas ocasiones se realiza una ecografa para confirmar el correcto desarrollo y evolucin del beb. Esta prueba se realiza con ondas sonoras inofensivas para el beb, de modo que el profesional pueda calcular con ms precisin la fecha del parto.   Discuta las posibilidades de la anestesia si necesita cesrea.  Algunas veces se realizan pruebas especializadas del lquido amnitico que rodea al beb. Esta prueba se denomina amniocentesis. El lquido amnitico se obtiene introduciendo una aguja en el abdomen (vientre). En ocasiones se lleva a cabo cerca del final del embarazo, si es necesario adelantar el parto. En este caso se realiza para asegurarse de que los pulmones del beb estn lo suficientemente maduros como para que pueda vivir fuera del tero. CAMBIOS QUE OCURREN EN EL TERCER TRIMESTRE DEL EMBARAZO Su  organismo atravesar diferentes cambios durante el embarazo que varan de una persona a otra. Converse con el profesional que la asiste acerca los cambios que usted note y que la preocupen.  Durante el ltimo trimestre probablemente sienta un aumento del apetito. Es normal tener "antojos" de ciertas comidas. Esto vara de una persona a otra y de un embarazo a otro.   Podrn aparecer las primeras estras en las caderas, abdomen y mamas. Estos son cambios normales del cuerpo durante el embarazo. No existen medicamentos ni ejercicios que puedan prevenir estos cambios.   El estreimiento puede tratarse con un laxante o agregando fibra a su dieta. Beber grandes cantidades de lquidos, tomar fibras en forma de verduras, frutas y granos integrales es de gran ayuda.   Tambin es beneficioso practicar actividad fsica. Si ha sido una persona activa hasta el embarazo, podr continuar con la mayora de las actividades durante el mismo. Si ha sido menos activa,   puede ser beneficioso que comience con un programa de ejercicios, como realizar caminatas. Consulte con el profesional que la asiste antes de comenzar un programa de ejercicios.   Evite el consumo de cigarrillos, el alcohol, los medicamentos no prescritos y las "drogas de la calle" durante el embarazo. Estas sustancias qumicas afectan la formacin y el desarrollo del beb. Evite estas sustancias durante todo el embarazo para asegurar el nacimiento de un beb sano.   Dolor de espalda, venas varicosas y hemorroides podran aparecer o empeorar.   Los movimientos del beb pueden ser ms bruscos y aparecer ms a menudo.   Puede que note dificultades para respirar facilmente.   El ombligo podra salrsele hacia afuera.   Puede segregar un lquido amarillento (calostro) de las mamas.   Puede segregar mucus con sangre. Esto normalmente ocurre unos pocos das a una semana antes de que comience el trabajo de parto.  INSTRUCCIONES PARA EL CUIDADO  DOMICILIARIO  La mayor parte de los cuidados que se aconsejan son los mismos que los indicados para las primeras etapas del embarazo. Es importante que concurra a todas las citas con el profesional y siga sus instrucciones con respecto a los medicamentos que deba utilizar, a la actividad fsica y a la dieta.   Durante el embarazo debe obtener nutrientes para usted y para su beb. Consuma alimentos balanceados a intervalos regulares. Elija alimentos como carne, pescado, leche y otros productos lcteos descremados, verduras, frutas, panes integrales y cereales. El profesional le informar cul es el aumento de peso ideal.   Las relaciones sexuales pueden continuarse hasta casi el final del embarazo, si no se presentan otros problemas como prdida prematura (antes de tiempo) de lquido amnitico, hemorragia vaginal o dolor abdominal (en el vientre).   Realice actividad fsica todos los das, si no tiene restricciones. Consulte con el profesional que la asiste si no sabe con certeza si determinados ejercicios son seguros. El mayor aumento de peso se produce en los dos ltimos trimestres del embarazo.   Haga reposo con frecuencia, con las piernas elevadas, o segn lo necesite para evitar los calambres y el dolor de cintura.   Use un buen sostn o como los que se usan para hacer deportes para aliviar la sensibilidad de las mamas. Tambin puede serle til si lo usa mientras duerme. Si pierde calostro, podr utilizar apsitos en el sostn.   No utilice la baera con agua caliente, baos turcos y saunas.   Colquese el cinturn de seguridad cuando conduzca. Este la proteger a usted y al beb en caso de accidente.   Evite comer carne cruda y el contacto con los utensilios y desperdicios de los gatos. Estos elementos contienen grmenes que pueden causar defectos de nacimiento en el beb.   Es fcil perder algo de orina durante el embarazo. Apretar y fortalecer los msculos de la pelvis la ayudar con este  problema. Practique detener la miccin cuando est en el bao. Estos son los mismos msculos que necesita fortalecer. Son tambin los mismos msculos que utiliza cuando trata de evitar los gases. Puede practicar apretando estos msculos diez veces, y repetir esto tres veces por da aproximadamente. Una vez que conozca qu msculos debe contraer, no realice estos ejercicios durante la miccin. Puede favorecerle una infeccin si la orina vuelve hacia atrs.   Pida ayuda si tiene necesidades econmicas, de asesoramiento o nutricionales durante el embarazo. El profesional podr ayudarla con respecto a estas necesidades, o derivarla a otros especialistas.   Practique la ida hasta el   hospital a modo de prueba.   Tome clases prenatales junto con su pareja para comprender, practicar y hacer preguntas acerca del trabajo de parto y el nacimiento.   Prepare la habitacin del beb.   No viaje fuera de la ciudad a menos que sea absolutamente necesario y con el consejo del mdico.   Use slo zapatos bajos sin taco para tener un mejor equilibrio y prevenir cadas.  EL CONSUMO DE MEDICAMENTOS Y DROGAS DURANTE EL EMBARAZO  Contine tomando las vitaminas apropiadas para esta etapa tal como se le indic. Las vitaminas deben contener un miligramo de cido flico y deben suplementarse con hierro. Guarde todas las vitaminas fuera del alcance de los nios. La ingestin de slo un par de vitaminas o comprimidos que contengan hierro pueden ocasionar la muerte en un beb o en un nio pequeo.   Evite el uso de medicamentos, inclusive los de venta libre, que no hayan sido prescritos o indicados por el profesional que la asiste. Algunos medicamentos pueden causar problemas fsicos al beb. Utilice los medicamentos de venta libre o de prescripcin para el dolor, el malestar o la fiebre, segn se lo indique el profesional que lo asiste. No utilice aspirina, ibuprofeno (Motrin, Advil, Nuprin) o naproxeno (Aleve) a menos que  el profesional la autorice.   El alcohol se asocia a cierto nmero de defectos del nacimiento, incluido el sndrome de alcoholismo fetal. Debe evitar el consumo de alcohol en cualquiera de sus formas. El cigarrillo causa nacimientos prematuros y bebs de bajo peso al nacer. Las drogas de la calle son muy nocivas para el beb y estn absolutamente prohibidas. Un beb que nace de una madre adicta, ser adicto al nacer. Ese beb tendr los mismos sntomas de abstinencia que un adulto.   Infrmele al profesional si consume alguna droga.  SOLICITE ATENCIN MDICA SI: Tiene alguna preocupacin durante el embarazo. Es mejor que llame para formular las preguntas si no puede esperar hasta la prxima visita, que sentirse preocupada por ellas.  DECISIONES ACERCA DE LA CIRCUNCISIN Usted puede saber o no cul es el sexo de su beb. Si es un varn, ste es el momento de pensar acerca de la circuncisin. La circuncisin es la extirpacin del prepucio. Esta es la piel que cubre el extremo sensible del pene. No hay un motivo mdico que lo justifique. Generalmente la decisin se toma segn lo que sea popular en ese momento, o se basa en creencias religiosas. Podr conversar estos temas con el profesional que la asiste. SOLICITE ATENCIN MDICA DE INMEDIATO SI:  La temperatura oral se eleva sin motivo por encima de 102 F (38.9 C) o segn le indique el profesional que la asiste.   Tiene una prdida de lquido por la vagina (canal de parto). Si sospecha una ruptura de las membranas, tmese la temperatura y llame al profesional para informarlo sobre esto.   Observa unas pequeas manchas, una hemorragia vaginal o elimina cogulos. Avsele al profesional acerca de la cantidad y de cuntos apsitos est utilizando.   Presenta un olor desagradable en la secrecin vaginal y observa un cambio en el color, de transparente a blanco.   Ha vomitado durante ms de 24 horas.   Presenta escalofros o fiebre.   Comienza a  sentir falta de aire.   Siente ardor al orinar.   Baja o sube ms de 900 g (ms de 2 libras), o segn lo indicado por el profesional que la asiste. Observa que sbitamente se le hinchan el rostro, las manos, los   pies o las piernas.   Presenta dolor abdominal. Las molestias en el ligamento redondo son una causa benigna (no cancerosa) frecuente de dolor abdominal durante el embarazo, pero el profesional que la asiste deber evaluarlo.   Presenta dolor de cabeza intenso que no se alivia.   Si no siente los movimientos del beb durante ms de tres horas. Si piensa que el beb no se mueve tanto como lo haca habitualmente, coma algo que contenga azcar y recustese sobre el lado izquierdo durante una hora. El beb debe moverse al menos 4  5 veces por hora. Comunquese inmediatamente si el beb se mueve menos que lo indicado.   Se cae, se ve involucrada en un accidente automovilstico o sufre algn tipo de traumatismo.   En su hogar hay violencia mental o fsica.  Document Released: 02/05/2005 Document Revised: 01/08/2011 [employer] Patient Information 2012 [employer]. Eleccin del mtodo anticonceptivo (Birth Control Choices) Los anticonceptivos son mtodos, prcticas o dispositivos para evitar que se produzca el embarazo en una mujer sexualmente activa.  A continuacin se indican algunos mtodos para evitar el embarazo.  No tener relaciones sexuales (abstinencia) es el mtodo ms seguro para el control de la natalidad. Requiere autocontrol. No hay riesgo de contraer enfermedades de transmisin sexual ni el sndrome de inmunodeficiencia adquirida (SIDA).   Abstinencia peridica requiere autocontrol en ciertos perodos del mes.   Mtodo calendario, teniendo en cuenta el momento de sus perodos menstruales todos los meses.   El mtodo de ovulacin es evitar tener relaciones sexuales en la poca en la que est ovulando (formando un vulo).   El mtodo simptotrmico es evitar tener  relaciones sexuales en la poca en la que est ovulando con la utilizacin de un termmetro y los sntomas de la ovulacin.   El mtodo de postovulacin es tener relaciones sexuales despus de la ovulacin.  Estos mtodos no protegen contra las infecciones transmitidas sexualmente ni contra el SIDA.  Las pldoras anticonceptivas contienen estrgenos y progesterona. Estos medicamentos actan impidiendo la ovulacin (la liberacin del huevo del ovario). El mdico prescribir pldoras anticonceptivas, y le har preguntas acerca de los riesgos de tomarlas. Las pldoras anticonceptivas no protegen contra las infecciones transmitidas sexualmente ni contra el SIDA.   La "minipldora" slo contiene progesterona. Deben tomarse todos los das del mes y debe prescribirlas el mdico. Estos mtodos no protegen contra las infecciones transmitidas sexualmente ni contra el SIDA.   Los anticonceptivos de emergencia, tambin llamados "la pldora del da despus" La pldora puede tomarse inmediatamente despus de tener relaciones sexuales o hasta cinco das despus, si piensa que su mtodo anticonceptivo ha fallado, o fue forzada a tener sexo. Es ms efectiva si se toma poco tiempo despus. No use los anticonceptivos de emergencia como nico mtodo anticonceptivo. Los anticonceptivos de emergencia estn disponibles sin prescripcin mdica. Consltelo con su farmacutico.   Los condones son una vaina delgada de ltex, material sinttico o piel de cordero que se usan en el pene durante el acto sexual. Pueden tener un esperimicida incorporado. Los condones de ltex evitan el embarazo y las enfermedades de transmisin sexual. Los condones "naturales" o de piel de cordero evitan el embarazo pero no protegen contra las enfermedades de transmisin sexual ni el sida.   Los condones femeninos son una vaina blanda y que se adaptan suavemente a la vagina antes de las relaciones sexuales. Pueden evitar el embarazo y las enfermedades  de transmisin sexual, inclusive el sida,   La esponja es una pieza de poliuretano circular suave   con espermicida que se inserta en la vagina luego de humedecerla y antes de tener relaciones sexuales. No requiere prescripcin mdica. Estos mtodos no protegen contra las infecciones transmitidas sexualmente ni contra el SID.   El diafragma es una barrera de ltex redonda y suave que debe ser recomendado por un profesional. Se inserta en la vagina, junto con un gel espermicida. Luego de prepararlo, insrtelo antes de las relaciones sexuales. Debe dejar el diafragma colocado en la vagina durante 6 a 8 horas. La eliminacin y reinsercin siempre debe realizarse con un espermicida. Este mtodo no protege contra las infecciones transmitidas sexualmente ni contra el SIDA.   Las inyecciones de progesterona se administran cada 3 meses como mtodo anticonceptivo. Estas inyecciones contienen progesterona sinttica y no contienen estrgenos, Esta hormona impide que los ovarios liberen vulos. Tambin hacen que el moco cervical se espese y modifique el tejido de recubrimiento interno del tero. Esto hace ms difcil que los espermatozoides sobrevivan en el tero. No protege contra las infecciones transmitidas sexualmente o el sida.   Parche para el control de la natalidad contiene hormonas similares a las de las pldoras, de modo que la efectividad, los riesgos y los efectos secundarios son los mismos.Deben cambiarse una vez por semana y se utilizan bajo prescripcin mdica. Es menos efectivo en las mujeres con sobrepeso. No protege contra las infecciones transmitidas sexualmente ni el sida.   Anillo vaginal contiene hormonas similares a las que contienen las pldoras anticonceptivas. Se deja colocado durante tres semanas, se lo retira durante una semana y luego se coloca uno nuevo. Trae un timer para colocar en el bolsillo y recordar cundo debe retirarlo y colocarse uno nuevo. Es necesario un examen previo y la  prescripcin del mdico, al igual que con las pldoras y el parche. No protege contra las infecciones transmitidas sexualmente o el sida.   Las inyecciones de estrgeno ms progesterona se administran cada 28 a 30 das. Pueden aplicarse en el brazo, muslo o nalgas. No protege contra las infecciones transmitidas sexualmente ni contra el SIDA.   Dispositivo intrauterino (DIU): T de cobre o T con progesterona es un dispositivo con forma de T que se coloca en el tero de la mujer durante el perodo menstrual, para prevenir el embarazo. La T de cobre dura 10 aos y el dispositivo de progesterona puede durar 5 aos. El DIU de progesterona tambin puede ayudar a controlar los perodos menstruales abundantes. No protege contra las infecciones transmitidas sexualmente ni contra el SIDA. El DIU de T de cobre se puede utilizar como dispositivo de emergencia, si se inserta dentro de los 5 das de tener relaciones sexuales sin proteccin.   El capuchn cervical es una barrera de ltex o taza plstica redonda y suave que cubre el cuello del tero y debe ser colocada por un mdico. No necesitar utilizar un espermicida para colocarlo o retirarlo cada vez que tiene relaciones sexuales. No protege contra las infecciones transmitidas sexualmente ni contra el SIDA.   Los espermicidas son qumicos que matan o bloquean el esperma y no lo dejan ingresar al cuello del tero y al tero. Vienen en forma de cremas, geles, supositorios, espuma o pastillas, y no requieren prescripcin. Se insertan en la vagina con un aplicador antes de tener relaciones sexuales. Esto debe repetirse cada vez que tiene relaciones sexuales.   El retiro es un mtodo en el que el hombre retira el pene durante las relaciones sexuales antes de llegar al clmax y depositar el esperma. No protege contra las   infecciones transmitidas sexualmente ni contra el SIDA.   La ligadura de trompas en la mujer se realiza sellando quirrgicamente las trompas de [place]  lo que impide que el vulo descienda hacia el tero. No protege contra las infecciones transmitidas sexualmente ni contra el SIDA.   La esterilizacin masculina es cuando al hombre se le atan los conductos quirrgicamente (vasectoma) para que el esperma no ingrese a la vagina durante las relaciones sexuales. No protege contra las infecciones transmitidas sexualmente ni contra el SIDA.  Independientemente del mtodo anticonceptivo que usted elija, es importante que utilice alguna forma de proteccin contra las infecciones que se transmiten sexualmente. Document Released: 04/28/2005 Document Revised: 05/31/2010 [employer] Patient Information 2012 [employer]. Amamantar al beb (Breastfeeding) LOS BENEFICIOS DE AMAMANTAR Para el beb  La primera leche (calostro ) ayuda al mejor funcionamiento del sistema digestivo del beb.   La leche tiene anticuerpos que provienen de la madre y que ayudan a prevenir las infecciones en el beb.   Hay una menor incidencia de asma, enfermedades alrgicas y SMSI (sndrome de muerte sbita nfantil).   Los nutrientes que contiene la leche materna son mejores que las frmulas para el bibern y favorecen el desarrollo cerebral.   Los bebs amamantados sufren menos gases, clicos y constipacin.  Para la mam  La lactancia materna favorece el desarrollo de un vnculo muy especial entre la madre y el beb.   Es ms conveniente, siempre disponible a la temperatura adecuada y ms econmica que la leche maternizada.   Consume caloras en la madre y la ayuda a perder el peso ganado durante el embarazo.   Favorece la contraccin del tero a su tamao normal, de manera ms rpida y disminuye las hemorragias luego del parto.   Las madres que amamantan tienen menor riesgo de desarrollar cncer de mama.  AMAMNTELO CON FRECUENCIA  Un beb sano, nacido a trmino, puede amamantarse con tanta frecuencia como cada hora, o espaciar las comidas cada tres horas.   Esta  frecuencia variar de un beb a otro. Observe al beb cuando manifieste signos de hambre, antes que regirse por el reloj.   Amamntelo tan seguido como el beb lo solicite, o cuando usted sienta la necesidad de aliviar sus mamas.   Despierte al beb si han pasado 3  4 horas desde la ltima comida.   El amamantamiento frecuente la ayudar a producir ms leche y a prevenir problemas de dolor en los pezones e hinchazn de las mamas.  LA POSICIN DEL BEB PARA AMAMANTARLO  Ya sea que se encuentre acostada o sentada, asegrese que el abdomen del beb enfrente el suyo.   Sostenga la mama con el pulgar por arriba y el resto de los dedos por debajo. Asegrese que sus dedos se encuentren lejos del pezn y de la boca del beb.   Toque suavemente los labios del beb y la mejilla ms cercana a la mama con el dedo o el pezn.   Cuando la boca del beb se abra lo suficiente, introduzca el pezn y la zona oscura que lo rodea tanto como le sea posible dentro de la boca.   Coloque a beb cerca suyo de modo que su nariz y mejillas toquen las mamas al mamar.  LAS COMIDAS  La duracin de cada comida vara de un beb a otro y de una comida a otra.   El beb debe succionar alrededor de dos o tres minutos para que le llegue leche. Esto se denomina "bajada". Por este motivo, permita que   el nio se alimente en cada mama todo lo que desee. Terminar de mamar cuando haya recibido la cantidad adecuada de nutrientes.   Para detener la succin coloque su dedo en la comisura de la boca del nio y deslcelo entre sus encas antes de quitarle la mama de la boca. Esto la ayudar a evitar el dolor en los pezones.  REDUCIR LA CONGESTIN DE LAS MAMAS  Durante la primera semana despus del parto, usted puede experimentar congestin en las mamas. Cuando las mamas estn congestionadas, se sienten calientes, llenas y molestas al tacto. Puede reducir la congestin si:   Lo amamanta frecuentemente, cada 2-3 horas. Las mams que  amamantan pronto y con frecuencia tienen menos problemas de congestin.   Coloque bolsas fras livianas entre cada mamada. Esto ayuda a reducir la hinchazn. Envuelva las bolsas de hielo en una toalla liviana para proteger su piel.   Aplique compresas hmedas calientes sobre la mama durante 5 a 10 minutos antes de amamantar al nio. Esto aumenta la circulacin y ayuda a que la leche fluya.   Masajee suavemente la mama antes y durante la alimentacin.   Asegrese que el nio vaca al menos una mama antes de cambiar de lado.   Use un sacaleche para vaciar la mama si el beb se duerme o no se alimenta bien. Tambin podr quitarse la leche con esta bomba si tiene que volver al trabajo o siente que las mamas estn congestionadas.   Evite los biberones, chupetes o complementar la alimentacin con agua o jugos en lugar de la leche materna.   Verifique que el beb se encuentra en la posicin correcta mientras lo alimenta.   Evite el cansancio, el estrs y la anemia   Use un soutien que sostenga bien sus mamas y evite los que tienen aro.   Consuma una dieta balanceada y beba lquidos en cantidad.  Si sigue estas indicaciones, la congestin debe mejorar en 24 a 48 horas. Si an tiene dificultades, consulte a su asesor en lactancia. TENDR SUFICIENTE LECHE MI BEB? Algunas veces las madres se preocupan acerca de si sus bebs tendrn la leche suficiente. Puede asegurarse que el beb tiene la leche suficiente si:  El beb succiona y escucha que traga activamente.   El nio se alimenta al menos 8 a 12 veces en 24 horas. Alimntelo hasta que se desprenda por sus propios medios o se quede dormido en la primera mama (al menos durante 10 a 20 minutos), luego ofrzcale el otro lado.   El beb moja 5 a 6 paales descartables (6 a 8 paales de tela) en 24 horas cuando tiene 5  6 das de vida.   Tiene al menos 2-3 deposiciones todos los das en los primeros meses. La leche materna es todo el alimento que  el beb necesita. No es necesario que el nio ingiera agua o preparados de bibern. De hecho, para ayudar a que sus mamas produzcan ms leche, lo mejor es no darle al beb suplementos durante las primeras semanas.   La materia fecal debe ser blanda y amarillenta.   El beb debe aumentar 112 a 196 g por semana.  CUDESE Cuide sus mamas del siguiente modo:  Bese o dchese diariamente.   No lave sus pezones con jabn.   Comience a amamantar del lado izquierdo en una comida y del lado derecho en la siguiente.   Notar que aumenta el suministro de leche a los 2 a 5 das despus del parto. Puede sentir algunas molestias por   la congestin, lo que hace que sus mamas estn duras y sensibles. La congestin disminuye en 24 a 48 horas. Mientras tanto, aplique toallas hmedas calientes durante 5 a 10 minutos antes de amamantar. Un masaje suave y la extraccin de un poco de leche antes de amamantar ablandarn las mamas y har ms fcil que el beb se agarre. Use un buen sostn y seque al aire los pezones durante 10 a 15 minutos luego de cada alimentacin.   Solo utilice apsitos de algodn.   Utilice lanolina pura sobre los pezones luego de amamantar. No necesita lavarlos luego de alimentar al nio.  Cudese del siguiente modo:   Consuma alimentos bien balanceados y refrigerios nutritivos.   Beba leche, jugos de fruta y agua para satisfacer la sed (alrededor de 8 vasos por da).   Descanse lo suficiente.   Aumente la ingesta de calcio en la dieta (1200mg/da).   Evite los alimentos que usted nota que puedan afectar al beb.  SOLICITE ATENCIN MDICA SI:  Tiene preguntas que formular o dificultades con la alimentacin a pecho.   Necesita ayuda.   Observa una zona dura, roja y que le duele en la zona de la mama, y se acompaa de fiebre de 100.5 F (38.1 C) o ms.   El beb est muy somnoliento como para alimentarse bien o tiene problemas para dormir.   El beb moja menos de 6 paales por  da, a partir de los 5 das de vida.   La piel del beb o la parte blanca de sus ojos est ms amarilla de lo que estaba en el hospital.   Se siente deprimida.  Document Released: 04/28/2005 Document Revised: 01/08/2011 [employer] Patient Information 2012 [employer]. 

## 2011-03-10 NOTE — Progress Notes (Signed)
03/10/2011 Diabetes:  Provided 1 box of strips.  Lot: [id number] Expiration: 2012/08/29.  Maggie Anett Ranker, RN, RD, CDE

## 2011-03-10 NOTE — Progress Notes (Signed)
fbs-77-89 2hr [id number]-[id number] 4 out of 24 are out of range. Needs strips. U/S 1 wk ago, shows vtx, 57%, nml fluid, nml cx.

## 2011-03-10 NOTE — Progress Notes (Signed)
Edema: left leg 

## 2011-03-24 ENCOUNTER — Ambulatory Visit (INDEPENDENT_AMBULATORY_CARE_PROVIDER_SITE_OTHER): Payer: Self-pay | Admitting: Obstetrics & Gynecology

## 2011-03-24 ENCOUNTER — Encounter: Payer: Self-pay | Attending: Obstetrics & Gynecology | Admitting: Dietician

## 2011-03-24 VITALS — BP 103/69 | Temp 96.8°F | Wt 137.9 lb

## 2011-03-24 DIAGNOSIS — Z713 Dietary counseling and surveillance: Secondary | ICD-10-CM | POA: Insufficient documentation

## 2011-03-24 DIAGNOSIS — O0940 Supervision of pregnancy with grand multiparity, unspecified trimester: Secondary | ICD-10-CM

## 2011-03-24 DIAGNOSIS — O09299 Supervision of pregnancy with other poor reproductive or obstetric history, unspecified trimester: Secondary | ICD-10-CM

## 2011-03-24 DIAGNOSIS — O09529 Supervision of elderly multigravida, unspecified trimester: Secondary | ICD-10-CM

## 2011-03-24 DIAGNOSIS — O99810 Abnormal glucose complicating pregnancy: Secondary | ICD-10-CM

## 2011-03-24 DIAGNOSIS — O24419 Gestational diabetes mellitus in pregnancy, unspecified control: Secondary | ICD-10-CM

## 2011-03-24 DIAGNOSIS — Z86718 Personal history of other venous thrombosis and embolism: Secondary | ICD-10-CM

## 2011-03-24 LAB — POCT URINALYSIS DIP (DEVICE)
Bilirubin Urine: NEGATIVE
Ketones, ur: NEGATIVE mg/dL
Protein, ur: NEGATIVE mg/dL

## 2011-03-24 NOTE — Progress Notes (Signed)
Diabetes  FBS 77-97  PP only 1 out of range (149)   Continue diet control  Slight ankle swelling left ankle, no evidence of DVT, few varicosities

## 2011-03-24 NOTE — Progress Notes (Signed)
Diabetes Follow-up:  Provided 1 box strips Lot: [id number], EXpiration: 2012/09/29 and Lancets 1 box Lot: [id number] Expiration: 2015/07/20.  Maggie May, RN, RD, CDE.

## 2011-03-24 NOTE — Progress Notes (Signed)
Pulse 74. Swelling in left leg. No vaginal discharge. Pt received Tdap vaccine at [facility].

## 2011-04-14 ENCOUNTER — Ambulatory Visit (INDEPENDENT_AMBULATORY_CARE_PROVIDER_SITE_OTHER): Payer: Self-pay | Admitting: Advanced Practice Midwife

## 2011-04-14 ENCOUNTER — Other Ambulatory Visit: Payer: Self-pay | Admitting: Obstetrics & Gynecology

## 2011-04-14 ENCOUNTER — Encounter: Payer: Self-pay | Admitting: Advanced Practice Midwife

## 2011-04-14 DIAGNOSIS — O0940 Supervision of pregnancy with grand multiparity, unspecified trimester: Secondary | ICD-10-CM

## 2011-04-14 DIAGNOSIS — O09299 Supervision of pregnancy with other poor reproductive or obstetric history, unspecified trimester: Secondary | ICD-10-CM

## 2011-04-14 DIAGNOSIS — O09899 Supervision of other high risk pregnancies, unspecified trimester: Secondary | ICD-10-CM

## 2011-04-14 DIAGNOSIS — O99810 Abnormal glucose complicating pregnancy: Secondary | ICD-10-CM

## 2011-04-14 DIAGNOSIS — Z86718 Personal history of other venous thrombosis and embolism: Secondary | ICD-10-CM

## 2011-04-14 DIAGNOSIS — O09529 Supervision of elderly multigravida, unspecified trimester: Secondary | ICD-10-CM

## 2011-04-14 DIAGNOSIS — O24419 Gestational diabetes mellitus in pregnancy, unspecified control: Secondary | ICD-10-CM

## 2011-04-14 DIAGNOSIS — O0990 Supervision of high risk pregnancy, unspecified, unspecified trimester: Secondary | ICD-10-CM

## 2011-04-14 LAB — POCT URINALYSIS DIP (DEVICE)
Bilirubin Urine: NEGATIVE
Nitrite: NEGATIVE
Protein, ur: NEGATIVE mg/dL
pH: 7.5 (ref 5.0–8.0)

## 2011-04-14 NOTE — Progress Notes (Signed)
Vertex by leopolds.  Patient is Class A Gest Diabetes, no need for NSTs due to good control on no meds.  FBS (60-79), 2HrB (85-99), 2HrL ([id number]- ate more torillas on two days), 2HrD (88-120). Plan [place] at 38 weeks, NST at 40 weeks, Deliver at 40 weeks.  No evidence of DVT

## 2011-04-14 NOTE — Patient Instructions (Signed)
Embarazo - Tercer trimestre (Pregnancy - Third Trimester) El tercer trimestre del embarazo (los ltimos 3 meses) es el perodo de cambios ms rpidos que atraviesan usted y el beb. El aumento de peso es ms rpido. El beb alcanza un largo de aproximadamente 50 cm (20 pulgadas) y pesa entre 2,700 y 4,500 kg (6 a 10 libras). El beb gana ms tejido graso y ya est listo para la vida fuera del cuerpo de la madre. Mientras estn en el interior, los bebs tienen perodos de sueo y vigilia, succionan el pulgar y tienen hipo. Quizs sienta pequeas contracciones del tero. Este es el falso trabajo de parto. Tambin se las conoce como contracciones de Braxton-Hicks. Es como una prctica del parto. Los problemas ms habituales de esta etapa del embarazo incluyen mayor dificultad para respirar, hinchazn de las manos y los pies por retencin de lquidos y la necesidad de orinar con ms frecuencia debido a que el tero y el beb presionan sobre la vejiga.  EXAMENES PRENATALES  Durante los exmenes prenatales, deber seguir realizando pruebas de sangre, segn avance el embarazo. Estas pruebas se realizan para controlar su salud y la del beb. Tambin se realizan anlisis de sangre para conocer los niveles de hemoglobina. La anemia (bajo nivel de hemoglobina) es frecuente durante el embarazo. Para prevenirla, se administran hierro y vitaminas. Tambin le harn nuevas pruebas para descartar la diabetes. Podrn repetirle algunas de las pruebas que le hicieron previamente.   En cada visita le medirn el tamao del tero. Es para asegurarse de que el beb se desarrolla correctamente.   Tambin en cada visita la pesarn. Esto se realiza para asegurarse de que aumenta de peso al ritmo indicado y que usted y su beb evolucionan normalmente.   En algunas ocasiones se realiza una ecografa para confirmar el correcto desarrollo y evolucin del beb. Esta prueba se realiza con ondas sonoras inofensivas para el beb, de modo  que el profesional pueda calcular con ms precisin la fecha del parto.   Discuta las posibilidades de la anestesia si necesita cesrea.  Algunas veces se realizan pruebas especializadas del lquido amnitico que rodea al beb. Esta prueba se denomina amniocentesis. El lquido amnitico se obtiene introduciendo una aguja en el abdomen (vientre). En ocasiones se lleva a cabo cerca del final del embarazo, si es necesario adelantar el parto. En este caso se realiza para asegurarse de que los pulmones del beb estn lo suficientemente maduros como para que pueda vivir fuera del tero. CAMBIOS QUE OCURREN EN EL TERCER TRIMESTRE DEL EMBARAZO Su organismo atravesar diferentes cambios durante el embarazo que varan de una persona a otra. Converse con el profesional que la asiste acerca los cambios que usted note y que la preocupen.  Durante el ltimo trimestre probablemente sienta un aumento del apetito. Es normal tener "antojos" de ciertas comidas. Esto vara de una persona a otra y de un embarazo a otro.   Podrn aparecer las primeras estras en las caderas, abdomen y mamas. Estos son cambios normales del cuerpo durante el embarazo. No existen medicamentos ni ejercicios que puedan prevenir estos cambios.   El estreimiento puede tratarse con un laxante o agregando fibra a su dieta. Beber grandes cantidades de lquidos, tomar fibras en forma de verduras, frutas y granos integrales es de gran ayuda.   Tambin es beneficioso practicar actividad fsica. Si ha sido una persona activa hasta el embarazo, podr continuar con la mayora de las actividades durante el mismo. Si ha sido menos activa, puede ser beneficioso   que comience con un programa de ejercicios, como realizar caminatas. Consulte con el profesional que la asiste antes de comenzar un programa de ejercicios.   Evite el consumo de cigarrillos, el alcohol, los medicamentos no prescritos y las "drogas de la calle" durante el embarazo. Estas sustancias  qumicas afectan la formacin y el desarrollo del beb. Evite estas sustancias durante todo el embarazo para asegurar el nacimiento de un beb sano.   Dolor de espalda, venas varicosas y hemorroides podran aparecer o empeorar.   Los movimientos del beb pueden ser ms bruscos y aparecer ms a menudo.   Puede que note dificultades para respirar facilmente.   El ombligo podra salrsele hacia afuera.   Puede segregar un lquido amarillento (calostro) de las mamas.   Puede segregar mucus con sangre. Esto normalmente ocurre unos pocos das a una semana antes de que comience el trabajo de parto.  INSTRUCCIONES PARA EL CUIDADO DOMICILIARIO  La mayor parte de los cuidados que se aconsejan son los mismos que los indicados para las primeras etapas del embarazo. Es importante que concurra a todas las citas con el profesional y siga sus instrucciones con respecto a los medicamentos que deba utilizar, a la actividad fsica y a la dieta.   Durante el embarazo debe obtener nutrientes para usted y para su beb. Consuma alimentos balanceados a intervalos regulares. Elija alimentos como carne, pescado, leche y otros productos lcteos descremados, verduras, frutas, panes integrales y cereales. El profesional le informar cul es el aumento de peso ideal.   Las relaciones sexuales pueden continuarse hasta casi el final del embarazo, si no se presentan otros problemas como prdida prematura (antes de tiempo) de lquido amnitico, hemorragia vaginal o dolor abdominal (en el vientre).   Realice actividad fsica todos los das, si no tiene restricciones. Consulte con el profesional que la asiste si no sabe con certeza si determinados ejercicios son seguros. El mayor aumento de peso se produce en los dos ltimos trimestres del embarazo.   Haga reposo con frecuencia, con las piernas elevadas, o segn lo necesite para evitar los calambres y el dolor de cintura.   Use un buen sostn o como los que se usan para hacer  deportes para aliviar la sensibilidad de las mamas. Tambin puede serle til si lo usa mientras duerme. Si pierde calostro, podr utilizar apsitos en el sostn.   No utilice la baera con agua caliente, baos turcos y saunas.   Colquese el cinturn de seguridad cuando conduzca. Este la proteger a usted y al beb en caso de accidente.   Evite comer carne cruda y el contacto con los utensilios y desperdicios de los gatos. Estos elementos contienen grmenes que pueden causar defectos de nacimiento en el beb.   Es fcil perder algo de orina durante el embarazo. Apretar y fortalecer los msculos de la pelvis la ayudar con este problema. Practique detener la miccin cuando est en el bao. Estos son los mismos msculos que necesita fortalecer. Son tambin los mismos msculos que utiliza cuando trata de evitar los gases. Puede practicar apretando estos msculos diez veces, y repetir esto tres veces por da aproximadamente. Una vez que conozca qu msculos debe contraer, no realice estos ejercicios durante la miccin. Puede favorecerle una infeccin si la orina vuelve hacia atrs.   Pida ayuda si tiene necesidades econmicas, de asesoramiento o nutricionales durante el embarazo. El profesional podr ayudarla con respecto a estas necesidades, o derivarla a otros especialistas.   Practique la ida hasta el hospital a modo   de prueba.   Tome clases prenatales junto con su pareja para comprender, practicar y hacer preguntas acerca del trabajo de parto y el nacimiento.   Prepare la habitacin del beb.   No viaje fuera de la ciudad a menos que sea absolutamente necesario y con el consejo del mdico.   Use slo zapatos bajos sin taco para tener un mejor equilibrio y prevenir cadas.  EL CONSUMO DE MEDICAMENTOS Y DROGAS DURANTE EL EMBARAZO  Contine tomando las vitaminas apropiadas para esta etapa tal como se le indic. Las vitaminas deben contener un miligramo de cido flico y deben suplementarse con  hierro. Guarde todas las vitaminas fuera del alcance de los nios. La ingestin de slo un par de vitaminas o comprimidos que contengan hierro pueden ocasionar la muerte en un beb o en un nio pequeo.   Evite el uso de medicamentos, inclusive los de venta libre, que no hayan sido prescritos o indicados por el profesional que la asiste. Algunos medicamentos pueden causar problemas fsicos al beb. Utilice los medicamentos de venta libre o de prescripcin para el dolor, el malestar o la fiebre, segn se lo indique el profesional que lo asiste. No utilice aspirina, ibuprofeno (Motrin, Advil, Nuprin) o naproxeno (Aleve) a menos que el profesional la autorice.   El alcohol se asocia a cierto nmero de defectos del nacimiento, incluido el sndrome de alcoholismo fetal. Debe evitar el consumo de alcohol en cualquiera de sus formas. El cigarrillo causa nacimientos prematuros y bebs de bajo peso al nacer. Las drogas de la calle son muy nocivas para el beb y estn absolutamente prohibidas. Un beb que nace de una madre adicta, ser adicto al nacer. Ese beb tendr los mismos sntomas de abstinencia que un adulto.   Infrmele al profesional si consume alguna droga.  SOLICITE ATENCIN MDICA SI: Tiene alguna preocupacin durante el embarazo. Es mejor que llame para formular las preguntas si no puede esperar hasta la prxima visita, que sentirse preocupada por ellas.  DECISIONES ACERCA DE LA CIRCUNCISIN Usted puede saber o no cul es el sexo de su beb. Si es un varn, ste es el momento de pensar acerca de la circuncisin. La circuncisin es la extirpacin del prepucio. Esta es la piel que cubre el extremo sensible del pene. No hay un motivo mdico que lo justifique. Generalmente la decisin se toma segn lo que sea popular en ese momento, o se basa en creencias religiosas. Podr conversar estos temas con el profesional que la asiste. SOLICITE ATENCIN MDICA DE INMEDIATO SI:  La temperatura oral se eleva  sin motivo por encima de 102 F (38.9 C) o segn le indique el profesional que la asiste.   Tiene una prdida de lquido por la vagina (canal de parto). Si sospecha una ruptura de las membranas, tmese la temperatura y llame al profesional para informarlo sobre esto.   Observa unas pequeas manchas, una hemorragia vaginal o elimina cogulos. Avsele al profesional acerca de la cantidad y de cuntos apsitos est utilizando.   Presenta un olor desagradable en la secrecin vaginal y observa un cambio en el color, de transparente a blanco.   Ha vomitado durante ms de 24 horas.   Presenta escalofros o fiebre.   Comienza a sentir falta de aire.   Siente ardor al orinar.   Baja o sube ms de 900 g (ms de 2 libras), o segn lo indicado por el profesional que la asiste. Observa que sbitamente se le hinchan el rostro, las manos, los pies o las   piernas.   Presenta dolor abdominal. Las molestias en el ligamento redondo son una causa benigna (no cancerosa) frecuente de dolor abdominal durante el embarazo, pero el profesional que la asiste deber evaluarlo.   Presenta dolor de cabeza intenso que no se alivia.   Si no siente los movimientos del beb durante ms de tres horas. Si piensa que el beb no se mueve tanto como lo haca habitualmente, coma algo que contenga azcar y recustese sobre el lado izquierdo durante una hora. El beb debe moverse al menos 4  5 veces por hora. Comunquese inmediatamente si el beb se mueve menos que lo indicado.   Se cae, se ve involucrada en un accidente automovilstico o sufre algn tipo de traumatismo.   En su hogar hay violencia mental o fsica.  Document Released: 02/05/2005 Document Revised: 01/08/2011 [employer] Patient Information 2012 [employer]. 

## 2011-04-14 NOTE — Progress Notes (Signed)
Pelvic pressure. No vaginal discharge. Pulse 69. Swelling in left leg. Used interpreter Delorise Royals.

## 2011-04-21 ENCOUNTER — Ambulatory Visit (INDEPENDENT_AMBULATORY_CARE_PROVIDER_SITE_OTHER): Payer: Self-pay | Admitting: Physician Assistant

## 2011-04-21 VITALS — BP 115/76 | Temp 98.2°F | Wt 138.5 lb

## 2011-04-21 DIAGNOSIS — O99810 Abnormal glucose complicating pregnancy: Secondary | ICD-10-CM

## 2011-04-21 DIAGNOSIS — O24419 Gestational diabetes mellitus in pregnancy, unspecified control: Secondary | ICD-10-CM

## 2011-04-21 LAB — POCT URINALYSIS DIP (DEVICE)
Ketones, ur: NEGATIVE mg/dL
Leukocytes, UA: NEGATIVE
Protein, ur: NEGATIVE mg/dL
Specific Gravity, Urine: 1.025 (ref 1.005–1.030)
pH: 7 (ref 5.0–8.0)

## 2011-04-21 NOTE — Progress Notes (Signed)
Addended by: Sherre Lain A on: 04/21/2011 01:51 PM   Modules accepted: Orders

## 2011-04-21 NOTE — Patient Instructions (Signed)
Dispositivos anticonceptivos (DIU) (Contraceptive Devices [IUD]) DIU significa Dispositivo Intrauterino. Intrauterino significa que est dentro del tero. El propsito del DIU es Location manager. El DIU dificulta que los espermatozoides ingresen al tero y a las trompas de Falopio, Environmental consultant en el cual se fertilizan los vulos. Tambin alteran las secreciones del cuello del tero, lo que lo convierte en una barrera ms fuerte para impedir el paso de los espermatozoides. Tambin afectan el recubrimiento interno del tero, lo que dificulta que el vulo se implante. Los DIUs no producen aborto. Hay dos tipos de DIU disponibles:  El DIU de cobre [place] pequea cantidad de cobre dentro del tero. Esto evita que los espermatozoides ingresen al tero y suban por las trompas de Falopio donde el vulo se fertiliza. El DIU de cobre adems puede daar el vulo o prevenir que el vulo fertilizado se adhiera al recubrimiento interior del tero. Puede dejarse colocado durante 10 aos. El DIU de cobre se [place] como dispositivo anticonceptivo de Associate Professor, si se inserta dentro de los [address]  [address] de tener relaciones sexuales sin proteccin.   El DIU hormonal contiene progestina (progesterona sinttica) y Antony Blackbird esta hormona dentro del tero. La hormona hace ms espesa la mucosidad del cuello del tero, lo que evita que los espermatozoides ingresen al tero. Tambin debilita los espermatozoides que ingresan, de forma tal que el espermatozoide y el vulo no podrn mantenerse en la trompa de falopio. Tambin hace que la membrana que recubre internamente al tero se vuelva ms fina, lo que dificulta que el vulo fertilizado se adhiera al tero. La hormona progestina en el DIU disminuye la cantidad de sangrado durante el perodo menstrual y puede ser beneficioso en mujeres que tienen perodos menstruales profusos. El DIU hormonal puede dejarse en el lugar durante 5 aos.  EFECTOS SECUNDARIOS DEL DIU:  Pueden producirse  clicos o mayor dolor durante los perodos.   Puede producir perodos ms fuertes y prolongados, lo que puede provocar una falta de glbulos rojos (anemia) e interferir con su actividad diaria o sexual.  Este mtodo anticonceptivo no suele ser la mejor opcin en mujeres que tienen perodos abundantes o prolongados. Una mejor opcin puede ser el uso de pastillas anticonceptivas. Los DIUs funcionan mejor en mujeres que ya han tenido un [place], porque su cuello uterino est ms abierto, lo que hace que la insercin del dispositivo sea ms fcil y menos dolorosa. Sin embargo, muchas mujeres sin hijos Advertising account planner. Uno de los [employer]epsi[employer] principales en la seleccin de pacientes es prevenir la colocacin no intencional de un DIU en pacientes que tienen una ETS (enfermedad de transmisin sexual), que estn en un riesgo alto de ETS, o que ya estn embarazadas. Por esta razn el DIU se inserta durante, o bien inmediatamente despus, de un perodo menstrual. RAZONES POR LAS CUALES NO SE DEBE UTILIZAR UN DIU:  La forma del tero o del cuello uterino no es normal.   Usted tiene o ha tenido una infeccin plvica, tal como una ETS, en los ltimos 3 meses.   Tiene o sospecha tener [employer] rganos femeninos.   Se ha realizado un papanicolau y el resultado es anormal.   Sufre de determinadas enfermedades del hgado.   Existe una infeccin o inflamacin grave en el cuello uterino (cervicitis).   Presenta una hemorragia vaginal sin motivo determinado.   Sufre trastornos de las vlvulas coronarias (a menos que un cardilogo lo permita).   Es alrgica al cobre (esto es poco frecuente).   Ha tenido un  embarazo fuera del tero (embarazo ectpico).   Est embarazada o piensa que podra estarlo.   Ha tenido perodos profusos o prolongados, o fuertes dolores o calambres durante los perodos (excepto para el DIU hormonal).   Sufre o sospecha tener cncer plvico.   Tiene una ETS.   Tiene problemas en  el tero o en el cuello uterino (estenosis cervical, miomas uterinos) que dificultan la insercin del DIU.  Se debe remover el DIU cuando la mujer llega a la menopausia o [place]. BENEFICIOS DEL DIU:  Usted estar siempre preparada para Management consultant.   El DIU de cobre no interfiere con sus hormonas femeninas.   Puede usarse como anticonceptivo de Associate Professor.   Puede utilizarse mientras se [place].   Funciona inmediatamente luego de la insercin, y no produce problemas para embarazarse luego de ser removido.   No interfiere con el juego sexual previo.   El DIU de progesterona puede hacer que los perodos menstruales profusos sean ms leves.   El DIU de progesterona puede utilizarse durante 5 aos.   El DIU de cobre puede utilizarse durante 10 aos.  RIESGOS Y COMPLICACIONES  Colocar el DIU atravesando el tero, hacia la pelvis o el abdomen (perforacin del tero).   Perder el DIU (expulsin). Esto es ms comn en mujeres que nunca han tenido hijos.   Al embarazarse con un DIU, existe una mayor posibilidad de Neomia Dear infeccin y prdida del Psychiatrist.   Embarazo en la trompa de Falopio (ectpico).   ETS, en mujeres que tienen ms de una pareja sexual. El DIU no protege contra enfermedades de transmisin sexual.   Otros efectos secundarios menores pueden incluir:   Dolor de Training and development officer.   Ganas de vomitar (nuseas).   Inflamacin mamaria.   Depresin.  PROCEDIMIENTO  El DIU se inserta durante el perodo menstrual o inmediatamente luego de finalizado, para asegurarse de que no est embarazada.   Deber recostarse en una camilla de exmenes, desnuda de la cintura para abajo.   El profesional la examinar para Warehouse manager tamao y la posicin del tero.   Generalmente no es necesario Surveyor, mining.   El profesional podr darle una pldora para tomar una o dos horas antes del procedimiento.   En ocasiones, se utiliza un bloqueo paracervical para  bloquear y Chief Operating Officer las Scientist, water quality.   Se colocar una herramienta (espculo) en la vagina de forma tal que el profesional pueda ver el cuello uterino.   Se enviar una seal sonora hacia el tero para verificar la profundidad del mismo.   Un instrumento delgado (insertador DIU), que tiene forma similar a un sorbete para beber, se inserta a travs de la pequea apertura del cuello uterino y [place].   Luego se empuja el DIU con un [place], similar a [place], y Geophysical data processor se [place]. Pueden producirse calambres y algo de dolor durante la insercin.   Reljese para reducir las molestias.   Luego del procedimiento, es probable que pierda algo de [place]. Lleve toallas higinicas. Evite el uso de tampones durante 2 semanas. Es normal que sangre luego del procedimiento. Vara desde ligeras manchas durante algunos [employer] un sangrado similar a la menstruacin que puede durar hasta tres semanas. Puede ser [employer] menstruacin si est cerca del tiempo que tiene su perodo normal.   Tambin puede experimentar ligeros calambres. Solo tome medicamentos que se pueden comprar sin receta o recetados para Chief Technology Officer, Dentist o fiebre, como le indica el mdico. No utilice  aspirinas, ya que puede agravar el sangrado.   Practique verificar la cuerda que sale del cuello uterino, para asegurarse de que el DIU se encuentra en el tero.  INSTRUCCIONES PARA EL CUIDADO DOMICILIARIO  No conduzca vehculos por 24 horas.   Solo tome medicamentos que se pueden comprar sin receta o recetados para Chief Technology Officer, Dentist o fiebre, como le indica el mdico.   No utilice tampones, no se haga duchas vaginales ni tenga relaciones sexuales durante 2 semanas, o hasta que el profesional que la asiste lo apruebe.   Haga reposo en su casa durante 24 horas. Luego podr retornar a sus [employer], a menos que [employer] indique otra cosa.   Controle su DIU antes de [employer] sexual, para asegurarse de que est en Nature conservation officer. Asegrese de que puede sentir las cuerdas. Un DIU puede empujarse y perderse sin notarlo. Adems, si las cuerdas se sienten ms largas, puede significar que el DIU est salindose fuera del tero. Esto significa que no est brindando una correcta proteccin TEFL teacher.   [employer] medicamentos y en la forma que el profesional le ha indicado.   Asegrese de asistir a la cita de seguimiento, para que el profesional pueda asegurarse de que el DIU se Neurosurgeon. Luego de ese control, se recomienda realizar exmenes anuales, a menos que no pueda sentir las cuerdas del [address]   Controle que el DIU est en su lugar sintiendo las cuerdas luego de cada perodo menstrual.  SOLICITE ATENCIN MDICA SI:  La hemorragia es ms abundante que un ciclo menstrual normal.   La temperatura oral se eleva sin motivo por arriba de 102 F (38.9 C).   Siente calambres o un dolor que aumenta y que no se alivia con Tourist information centre manager. O si siente dolor abdominal que no parece estar relacionado con la misma zona en que sinti los clicos y Chief Technology Officer.   Se siente mareada, inusualmente dbil o tiene episodios de desmayo.   Comienza a Water engineer parte superior de los hombros (zona de los breteles).   Tiene problemas o dudas, que no han sido aclaradas por completo por el profesional.   Siente dolor abdominal.   Siente dolor durante la actividad sexual.   No puede sentir las cuerdas del DIU.   Anola Gurney una hemorragia vaginal anormal.   Siente el DIU en la apertura del cuello uterino en la vagina.   Sospecha que est embarazada.   Falta del periodo menstrual.   La cuerda del DIU est lastimando a su pareja sexual.   La cuerda del DIU se ha alargado.  Document Released: 10/16/2009 Document Revised: 08/13/2010 [facility] Patient Information 2012 [place], [place].

## 2011-04-21 NOTE — Progress Notes (Signed)
Having a lot of pain and pressure. Very uncomfortable when sitting.  Needs GC/Ch and GBS

## 2011-04-21 NOTE — Progress Notes (Signed)
No complaints. FBS <90, 2PP90% < 120 (145, 151 after eating 2 tortillas) GBS/CGC/Chl obtained today. FU with Kendell Bane for Lovenox refill. Vertex presentation. Plan IOL at 40 weeks secondary to GDM, if no labor before.

## 2011-04-28 ENCOUNTER — Ambulatory Visit (INDEPENDENT_AMBULATORY_CARE_PROVIDER_SITE_OTHER): Payer: Self-pay | Admitting: Obstetrics and Gynecology

## 2011-04-28 DIAGNOSIS — O0940 Supervision of pregnancy with grand multiparity, unspecified trimester: Secondary | ICD-10-CM

## 2011-04-28 DIAGNOSIS — O99810 Abnormal glucose complicating pregnancy: Secondary | ICD-10-CM

## 2011-04-28 DIAGNOSIS — O24419 Gestational diabetes mellitus in pregnancy, unspecified control: Secondary | ICD-10-CM

## 2011-04-28 DIAGNOSIS — O09529 Supervision of elderly multigravida, unspecified trimester: Secondary | ICD-10-CM

## 2011-04-28 DIAGNOSIS — O0990 Supervision of high risk pregnancy, unspecified, unspecified trimester: Secondary | ICD-10-CM

## 2011-04-28 DIAGNOSIS — O09299 Supervision of pregnancy with other poor reproductive or obstetric history, unspecified trimester: Secondary | ICD-10-CM

## 2011-04-28 DIAGNOSIS — O09899 Supervision of other high risk pregnancies, unspecified trimester: Secondary | ICD-10-CM

## 2011-04-28 LAB — POCT URINALYSIS DIP (DEVICE)
Bilirubin Urine: NEGATIVE
Glucose, UA: NEGATIVE mg/dL
Specific Gravity, Urine: 1.02 (ref 1.005–1.030)

## 2011-04-28 MED ORDER — ENOXAPARIN SODIUM 40 MG/0.4ML ~~LOC~~ SOLN: 40.0000 mg | SUBCUTANEOUS | Status: DC

## 2011-04-28 NOTE — Progress Notes (Signed)
CBGs all within range. FM/Labor precautions reviewed. Will schedule f/u growth ultrasound today. Refill on Lovenox provided

## 2011-04-28 NOTE — Progress Notes (Signed)
U/S 04/30/11 at 315pm.

## 2011-04-28 NOTE — Progress Notes (Signed)
Pt needs a prescription sent to [facility] so that she can get her lovenox.

## 2011-04-30 ENCOUNTER — Ambulatory Visit (HOSPITAL_COMMUNITY)
Admission: RE | Admit: 2011-04-30 | Discharge: 2011-04-30 | Disposition: A | Discharge status: Home / Self Care | Payer: Medicaid Other | Source: Ambulatory Visit | Attending: Obstetrics and Gynecology | Admitting: Obstetrics and Gynecology

## 2011-04-30 DIAGNOSIS — I82409 Acute embolism and thrombosis of unspecified deep veins of unspecified lower extremity: Secondary | ICD-10-CM | POA: Insufficient documentation

## 2011-04-30 DIAGNOSIS — O0990 Supervision of high risk pregnancy, unspecified, unspecified trimester: Secondary | ICD-10-CM

## 2011-04-30 DIAGNOSIS — O09529 Supervision of elderly multigravida, unspecified trimester: Secondary | ICD-10-CM | POA: Insufficient documentation

## 2011-04-30 DIAGNOSIS — O99810 Abnormal glucose complicating pregnancy: Secondary | ICD-10-CM | POA: Insufficient documentation

## 2011-05-01 ENCOUNTER — Telehealth: Payer: Self-pay | Admitting: *Deleted

## 2011-05-01 NOTE — Telephone Encounter (Signed)
Marcie Mowers called and left a message stating she is calling for Maria Cook- who needs a refill for Lovenox which patient gets from [facility] . States pharmacy said she needs prescription. Called and left a message we are working on this - please call us back and let us know if you have called the pharmacy in [place].  ( per discussion with staff patient was told  Yesterday we are contacting her doctor in [place] to get a refill-  Prescription and we haven't reached doctor yet.  We can not order prescription in [facility]- we have left phone voice messages for Dr. Ace Gins at [facility]  to please send in prescription for lovenox)

## 2011-05-01 NOTE — Telephone Encounter (Signed)
Received message from Ledora Bottcher that Delorise Royals - Spanish Interpreter had called stating [facility] did not receive the lovenox refill request.  Call Dr Ace Gins and spoke with someone stating Dr. Ace Gins was not in the office today.  Explained situation.  Person put refill request in the system and forwarded to Dr. Ace Gins for approval.  My name and number [phone] was given in case Dr. Ace Gins wants to speak with someone regarding this prescription.  Spoke with Delorise Royals - Spanish Interpreter.  Raynelle Fanning will call patient to let her know that medication was phoned to pharmacy for pick-up tomorrow.

## 2011-05-02 ENCOUNTER — Telehealth: Payer: Self-pay | Admitting: *Deleted

## 2011-05-02 NOTE — Telephone Encounter (Signed)
Dr Ace Gins from [facility] called stating that she called in Rx for Heparin 10,000 units sq bid and that we need to call pt and make her aware of the change.

## 2011-05-02 NOTE — Telephone Encounter (Signed)
Per Dr. Jolayne Panther rx is going to be changed back to Lovenox. Dr. Jolayne Panther spoke with Dr. Ace Gins and rx was sent to [facility]. Eda Royal will notify pt that Rx is ready to pick up.

## 2011-05-12 ENCOUNTER — Inpatient Hospital Stay (HOSPITAL_COMMUNITY)
Admission: AD | Admit: 2011-05-12 | Discharge: 2011-05-13 | DRG: 775 | Disposition: A | Discharge status: Home / Self Care | Payer: Medicaid Other | Source: Ambulatory Visit | Attending: Obstetrics & Gynecology | Admitting: Obstetrics & Gynecology

## 2011-05-12 ENCOUNTER — Encounter (HOSPITAL_COMMUNITY): Payer: Self-pay | Admitting: *Deleted

## 2011-05-12 DIAGNOSIS — Z86718 Personal history of other venous thrombosis and embolism: Secondary | ICD-10-CM

## 2011-05-12 DIAGNOSIS — O99814 Abnormal glucose complicating childbirth: Secondary | ICD-10-CM

## 2011-05-12 DIAGNOSIS — O0990 Supervision of high risk pregnancy, unspecified, unspecified trimester: Secondary | ICD-10-CM

## 2011-05-12 DIAGNOSIS — O0940 Supervision of pregnancy with grand multiparity, unspecified trimester: Secondary | ICD-10-CM

## 2011-05-12 DIAGNOSIS — O24419 Gestational diabetes mellitus in pregnancy, unspecified control: Secondary | ICD-10-CM

## 2011-05-12 DIAGNOSIS — O09899 Supervision of other high risk pregnancies, unspecified trimester: Secondary | ICD-10-CM

## 2011-05-12 LAB — POCT FERN TEST: Fern Test: POSITIVE

## 2011-05-12 LAB — CBC
HCT: 35.8 % — ABNORMAL LOW (ref 36.0–46.0)
Hemoglobin: 12.2 g/dL (ref 12.0–15.0)
MCHC: 34.1 g/dL (ref 30.0–36.0)
MCV: 94 fL (ref 78.0–100.0)
RDW: 14.1 % (ref 11.5–15.5)

## 2011-05-12 LAB — TYPE AND SCREEN
ABO/RH(D): O POS
Antibody Screen: NEGATIVE

## 2011-05-12 MED ORDER — LIDOCAINE HCL (PF) 1 % IJ SOLN
30.0000 mL | INTRAMUSCULAR | Status: DC | PRN
  Filled 2011-05-12: qty 30

## 2011-05-12 MED ORDER — IBUPROFEN 600 MG PO TABS
600.0000 mg | ORAL_TABLET | Freq: Four times a day (QID) | ORAL | Status: DC
  Administered 2011-05-12 – 2011-05-13 (×5): 600 mg via ORAL
  Filled 2011-05-12 (×5): qty 1

## 2011-05-12 MED ORDER — LANOLIN HYDROUS EX OINT: TOPICAL_OINTMENT | CUTANEOUS | Status: DC | PRN

## 2011-05-12 MED ORDER — TETANUS-DIPHTH-ACELL PERTUSSIS 5-2.5-18.5 LF-MCG/0.5 IM SUSP
0.5000 mL | Freq: Once | INTRAMUSCULAR | Status: AC
  Administered 2011-05-13: 0.5 mL via INTRAMUSCULAR

## 2011-05-12 MED ORDER — LACTATED RINGERS IV SOLN
INTRAVENOUS | Status: DC
  Administered 2011-05-12 (×2): via INTRAVENOUS

## 2011-05-12 MED ORDER — SENNOSIDES-DOCUSATE SODIUM 8.6-50 MG PO TABS
2.0000 | ORAL_TABLET | Freq: Every day | ORAL | Status: DC
  Administered 2011-05-12: 2 via ORAL

## 2011-05-12 MED ORDER — IBUPROFEN 600 MG PO TABS
600.0000 mg | ORAL_TABLET | Freq: Four times a day (QID) | ORAL | Status: DC | PRN
  Administered 2011-05-12: 600 mg via ORAL
  Filled 2011-05-12: qty 1

## 2011-05-12 MED ORDER — ONDANSETRON HCL 4 MG/2ML IJ SOLN: 4.0000 mg | Freq: Four times a day (QID) | INTRAMUSCULAR | Status: DC | PRN

## 2011-05-12 MED ORDER — OXYTOCIN 20 UNITS IN LACTATED RINGERS INFUSION - SIMPLE: 125.0000 mL/h | Freq: Once | INTRAVENOUS | Status: DC

## 2011-05-12 MED ORDER — OXYCODONE-ACETAMINOPHEN 5-325 MG PO TABS: 1.0000 | ORAL_TABLET | ORAL | Status: DC | PRN

## 2011-05-12 MED ORDER — NALBUPHINE SYRINGE 5 MG/0.5 ML: 5.0000 mg | INJECTION | INTRAMUSCULAR | Status: DC | PRN

## 2011-05-12 MED ORDER — ONDANSETRON HCL 4 MG PO TABS: 4.0000 mg | ORAL_TABLET | ORAL | Status: DC | PRN

## 2011-05-12 MED ORDER — ONDANSETRON HCL 4 MG/2ML IJ SOLN: 4.0000 mg | INTRAMUSCULAR | Status: DC | PRN

## 2011-05-12 MED ORDER — CITRIC ACID-SODIUM CITRATE 334-500 MG/5ML PO SOLN: 30.0000 mL | ORAL | Status: DC | PRN

## 2011-05-12 MED ORDER — WITCH HAZEL-GLYCERIN EX PADS: 1.0000 "application " | MEDICATED_PAD | CUTANEOUS | Status: DC | PRN

## 2011-05-12 MED ORDER — OXYTOCIN BOLUS FROM INFUSION
500.0000 mL | Freq: Once | INTRAVENOUS | Status: DC
  Filled 2011-05-12: qty 500
  Filled 2011-05-12: qty 1000

## 2011-05-12 MED ORDER — BENZOCAINE-MENTHOL 20-0.5 % EX AERO: 1.0000 "application " | INHALATION_SPRAY | CUTANEOUS | Status: DC | PRN

## 2011-05-12 MED ORDER — OXYCODONE-ACETAMINOPHEN 5-325 MG PO TABS
2.0000 | ORAL_TABLET | ORAL | Status: DC | PRN
  Administered 2011-05-12: 2 via ORAL
  Filled 2011-05-12: qty 2

## 2011-05-12 MED ORDER — PRENATAL MULTIVITAMIN CH
1.0000 | ORAL_TABLET | Freq: Every day | ORAL | Status: DC
  Administered 2011-05-12 – 2011-05-13 (×2): 1 via ORAL
  Filled 2011-05-12 (×2): qty 1

## 2011-05-12 MED ORDER — ENOXAPARIN SODIUM 40 MG/0.4ML ~~LOC~~ SOLN
40.0000 mg | SUBCUTANEOUS | Status: DC
  Administered 2011-05-12: 40 mg via SUBCUTANEOUS
  Filled 2011-05-12 (×3): qty 0.4

## 2011-05-12 MED ORDER — SIMETHICONE 80 MG PO CHEW: 80.0000 mg | CHEWABLE_TABLET | ORAL | Status: DC | PRN

## 2011-05-12 MED ORDER — ZOLPIDEM TARTRATE 5 MG PO TABS: 5.0000 mg | ORAL_TABLET | Freq: Every evening | ORAL | Status: DC | PRN

## 2011-05-12 MED ORDER — FLEET ENEMA 7-19 GM/118ML RE ENEM: 1.0000 | ENEMA | RECTAL | Status: DC | PRN

## 2011-05-12 MED ORDER — LACTATED RINGERS IV SOLN: 500.0000 mL | INTRAVENOUS | Status: DC | PRN

## 2011-05-12 MED ORDER — ACETAMINOPHEN 325 MG PO TABS: 650.0000 mg | ORAL_TABLET | ORAL | Status: DC | PRN

## 2011-05-12 MED ORDER — DIBUCAINE 1 % RE OINT: 1.0000 "application " | TOPICAL_OINTMENT | RECTAL | Status: DC | PRN

## 2011-05-12 MED ORDER — DIPHENHYDRAMINE HCL 25 MG PO CAPS: 25.0000 mg | ORAL_CAPSULE | Freq: Four times a day (QID) | ORAL | Status: DC | PRN

## 2011-05-12 NOTE — Progress Notes (Signed)
Pt may go to room 164.

## 2011-05-12 NOTE — Progress Notes (Signed)
With interpreter pt educated on calling out if needs anything.

## 2011-05-12 NOTE — Progress Notes (Signed)
Dr. Stinson at bedside. Assessment done and poc discussed with pt.  

## 2011-05-12 NOTE — H&P (Signed)
See mau note   See mau note.

## 2011-05-12 NOTE — Progress Notes (Signed)
[facility] Department of Clinical Social Work Documentation of Interpretation   I assisted ___Beka RN________________ with interpretation of ___questions__________________ for this patient.

## 2011-05-12 NOTE — ED Provider Notes (Signed)
History     Chief Complaint  Patient presents with  . Rupture of Membranes   HPI This is a 35 year old G6 P5 005 at 39 weeks and 5 days who presents the MAU with spontaneous rupture membranes that happened to 3 hours ago. She also had contractions that are approximately every 8 minutes and began shortly before her water broke. Her contractions are moderate. She denies vaginal discharge, vaginal bleeding, decreased fetal activity.  Her pregnancy was palpated only by diet controlled gestational diabetes. She was also on Lovenox for her history of DVT in her left leg during prior pregnancy.  OB History    Grav Para Term Preterm Abortions TAB SAB Ect Mult Living   6 5 5  0 0 0 0 0 0 5      Past Medical History  Diagnosis Date  . Gestational diabetes 02/24/2011  . Prior pregnancy complicated by DVT, antepartum 02/24/2011    History reviewed. No pertinent past surgical history.  History reviewed. No pertinent family history.  History  Substance Use Topics  . Smoking status: Never Smoker   . Smokeless tobacco: Never Used  . Alcohol Use: No    Allergies: No Known Allergies  Prescriptions prior to admission  Medication Sig Dispense Refill  . enoxaparin (LOVENOX) 40 MG/0.4ML SOLN Inject 0.4 mLs (40 mg total) into the skin daily.  11.2 mL  1  . Prenatal Vit-Fe Fumarate-FA (PRENATAL VITAMINS PLUS) 27-1 MG TABS Take 1 tablet by mouth daily.  90 tablet  3    Review of Systems  All other systems reviewed and are negative.   Physical Exam   Blood pressure 121/75, pulse 88, temperature 97.8 F (36.6 C), temperature source Oral, resp. rate 20, height 5\' 2"  (1.575 m), weight 63.504 kg (140 lb), SpO2 99.00%.  Physical Exam  Constitutional: She is oriented to person, place, and time. She appears well-developed and well-nourished.  HENT:  Head: Normocephalic.  Eyes: Conjunctivae are normal. Pupils are equal, round, and reactive to light.  Neck: Normal range of motion.    Cardiovascular: Normal rate, regular rhythm and normal heart sounds.  Exam reveals no gallop and no friction rub.   No murmur heard. Respiratory: Effort normal and breath sounds normal. No respiratory distress. She has no wheezes. She has no rales. She exhibits no tenderness.  GI: Soft. Bowel sounds are normal. She exhibits no distension and no mass. There is no tenderness. There is no rebound and no guarding.       Height approximates days. Estimated fetal weight 7/2 pounds by [employer]. Infant vertex.  Musculoskeletal: Normal range of motion. She exhibits no edema.  Neurological: She is alert and oriented to person, place, and time.  Skin: Skin is warm and dry.  Psychiatric: She has a normal mood and affect. Her behavior is normal. Judgment and thought content normal.   Dilation: 4.5 Effacement (%): 90 Cervical Position: [place]: -2 Presentation: Vertex Exam by:: Humphrey Rolls, RN  Fetal heart tracing baseline rate 140s with moderate variability, accelerations, and no decelerations. Contractions are every 5-8 minutes on tocometry.  Prenatal labs: ABO, Rh:   O+ Antibody:   negative Rubella:   immune RPR: NON REAC (10/01 1114)  HBsAg:   negative HIV:   nonreactive GBS:   negative 3 hour GTT: 65, 170, 176, 153  Fern positive  MAU Course  Procedures   Assessment and Plan  #5 35 year old G6 P5 005 at 39 weeks and 5 days #2 active labor #3 GBS negative #  4 history of DVT in prior pregnancy  I will admit patient to labor and delivery. I will hold the patient's Lovenox during active labor. As the patient is 4-5 cm, we will see if the patient's contractions are sufficient to cause labor. If not, we will start Pitocin. Patient desires to breast-feed and will have an IUD for birth control following delivery.  STINSON, JACOB JEHIEL 05/12/2011, 4:11 AM

## 2011-05-12 NOTE — Progress Notes (Signed)
[facility] Department of Clinical Social Work Documentation of Interpretation   I assisted ___Donna RN________________ with interpretation of __teaching____________________ for this patient.

## 2011-05-12 NOTE — Progress Notes (Signed)
Pt states had a gush of pinkish clear fluid at 0230

## 2011-05-12 NOTE — Progress Notes (Signed)
UR chart review completed.  

## 2011-05-12 NOTE — Progress Notes (Signed)
Pt called out stating baby coming. RN in room. Doreene Burke MD to come for delivery

## 2011-05-13 MED ORDER — IBUPROFEN 600 MG PO TABS: 600.0000 mg | ORAL_TABLET | Freq: Four times a day (QID) | ORAL | Status: AC

## 2011-05-13 MED ORDER — WARFARIN SODIUM 5 MG PO TABS
5.0000 mg | ORAL_TABLET | Freq: Once | ORAL | Status: AC
  Administered 2011-05-13: 5 mg via ORAL
  Filled 2011-05-13: qty 1

## 2011-05-13 MED ORDER — WARFARIN SODIUM 5 MG PO TABS: 5.0000 mg | ORAL_TABLET | Freq: Once | ORAL | Status: DC

## 2011-05-13 NOTE — Discharge Summary (Signed)
Obstetric Discharge Summary Reason for Admission: rupture of membranes Prenatal Procedures: ultrasound Intrapartum Procedures: spontaneous vaginal delivery Postpartum Procedures: Lovenox for Hx DVT  Complications-Operative and Postpartum: none Hemoglobin  Date Value Range Status  05/12/2011 12.2  12.0-15.0 (g/dL) Final     HCT  Date Value Range Status  05/12/2011 35.8* 36.0-46.0 (%) Final    Discharge Diagnoses: Term Pregnancy-delivered and Hx DVT w/ PRIOR PREGNANCY  Discharge Information: Date: 05/13/2011 Activity: pelvic rest Diet: routine Medications: Ibuprofen and Coumadin. Will consult w/ pharmacy RE: dosing, conversion from Lovenox Condition: stable Instructions: refer to practice specific booklet Discharge to: home   Newborn Data: Live born female  Birth Weight: 6 lb 0.3 oz (2730 g) APGAR: 9, 9  Home with mother. Breastfeeding Condoms  Maria Cook 05/13/2011, 7:53 AM

## 2011-05-13 NOTE — Discharge Summary (Signed)
Attestation of Attending Supervision of Advanced Practitioner: Evaluation and management procedures were performed by the PA/NP/CNM/OB Fellow under my supervision/collaboration. Chart reviewed, and agree with management and plan.  Milliani Herrada, M.D. 05/13/2011 8:40 AM   

## 2011-05-13 NOTE — Progress Notes (Signed)
Post Partum Day 1 Subjective: no complaints, up ad lib, voiding, tolerating PO, + flatus and Desires D/C today. Was told by OB in [place] that she would need to be on anticoagulant therapy for life, but Hx of DVT was in pregnancy only.  Objective: Blood pressure 101/65, pulse 60, temperature 97.9 F (36.6 C), temperature source Oral, resp. rate 20, height 5\' 2"  (1.575 m), weight 63.504 kg (140 lb), SpO2 100.00%, unknown if currently breastfeeding.  Physical Exam:  General: alert, cooperative and no distress Lochia: appropriate Uterine Fundus: firm Incision: NA DVT Evaluation: Negative Homan's sign. No cords or calf tenderness. No significant calf/ankle edema.   Basename 05/12/11 0415  HGB 12.2  HCT 35.8*    Assessment/Plan: Breastfeeding and Contraception Condoms D/C home per consult w/ Dr. Macon Large Coumadin 5 mg PO now. Will consult w/ pharmacy RE: transition from Lovenox to Coumadin. Will likely only need anticoag therapy x 6 weeks    LOS: 1 day   Dorathy Kinsman 05/13/2011, 7:46 AM

## 2011-05-13 NOTE — Progress Notes (Signed)
[facility] Department of Clinical Social Work Documentation of Interpretation   I assisted __[place] CNM_________________ with interpretation of __plan of care____________________ for this patient. 

## 2011-05-15 ENCOUNTER — Other Ambulatory Visit: Payer: Self-pay | Admitting: Family Medicine

## 2011-05-15 ENCOUNTER — Telehealth: Payer: Self-pay | Admitting: *Deleted

## 2011-05-15 ENCOUNTER — Other Ambulatory Visit (INDEPENDENT_AMBULATORY_CARE_PROVIDER_SITE_OTHER): Payer: Self-pay | Admitting: Family Medicine

## 2011-05-15 DIAGNOSIS — Z7901 Long term (current) use of anticoagulants: Secondary | ICD-10-CM

## 2011-05-15 LAB — PROTIME-INR
INR: 0.85 (ref <1.50)
Prothrombin Time: 11.8 seconds (ref 11.6–15.2)

## 2011-05-15 MED ORDER — WARFARIN SODIUM 5 MG PO TABS: 10.0000 mg | ORAL_TABLET | Freq: Once | ORAL | Status: DC

## 2011-05-15 NOTE — Telephone Encounter (Signed)
Called patient using language line spanish interpreter # [id number]. Patient notified of the dosage increase and will come in on Monday for repeat labs 05/19/11. Pt repeated and verbalized understanding of instructions.

## 2011-05-15 NOTE — Telephone Encounter (Signed)
Message copied by Mannie Stabile on Thu May 15, 2011  3:09 PM ------      Message from: Levie Heritage      Created: Thu May 15, 2011  2:28 PM       INR 0.85.  Patient on Coumadin 5mg  qhs - started 1/1.  Please call patient and increase to 10mg  qhs.  Needs to come back for INR check on Monday.

## 2011-05-19 ENCOUNTER — Other Ambulatory Visit: Payer: Self-pay

## 2011-05-19 DIAGNOSIS — O09899 Supervision of other high risk pregnancies, unspecified trimester: Secondary | ICD-10-CM

## 2011-05-20 ENCOUNTER — Other Ambulatory Visit: Payer: Self-pay | Admitting: Family Medicine

## 2011-05-20 ENCOUNTER — Telehealth: Payer: Self-pay | Admitting: *Deleted

## 2011-05-20 LAB — PROTIME-INR
INR: 0.93 (ref <1.50)
Prothrombin Time: 12.8 seconds (ref 11.6–15.2)

## 2011-05-20 MED ORDER — WARFARIN SODIUM 5 MG PO TABS: 15.0000 mg | ORAL_TABLET | Freq: Once | ORAL | Status: DC

## 2011-05-20 NOTE — Telephone Encounter (Signed)
Called pt w/Maria Cook. She was informed to increase dosage of coumadin to 15mg  ( 3 tabs of 5 mg each ) daily. Also she needs INR drawn on 05/23/11. Pt voiced understanding and stated that she will come in @ 0900 for lab draw on 05/23/11.

## 2011-05-20 NOTE — Telephone Encounter (Signed)
Message copied by Jill Side on Tue May 20, 2011 10:11 AM ------      Message from: Maria Cook      Created: Tue May 20, 2011  9:03 AM       Increased the patient's coumadin to 15mg  qhs.  Please have patient come Friday morning for INR.

## 2011-05-23 ENCOUNTER — Other Ambulatory Visit: Payer: Self-pay

## 2011-05-23 DIAGNOSIS — Z7901 Long term (current) use of anticoagulants: Secondary | ICD-10-CM

## 2011-05-23 LAB — PROTIME-INR: Prothrombin Time: 25.7 seconds — ABNORMAL HIGH (ref 11.6–15.2)

## 2011-05-26 ENCOUNTER — Telehealth: Payer: Self-pay | Admitting: *Deleted

## 2011-05-26 NOTE — Telephone Encounter (Signed)
Attempted to call pt with Maria Cook spanish interpreter, call went straight to voicemail box which is not set up. Will try again later.

## 2011-05-26 NOTE — Telephone Encounter (Signed)
Maria Cook called pt notified to come in on Thursday for INR

## 2011-05-26 NOTE — Telephone Encounter (Signed)
Message copied by Mannie Stabile on Mon May 26, 2011  8:57 AM ------      Message from: Levie Heritage      Created: Fri May 23, 2011 11:05 AM       INR okay.  Please have patient return next Thursday for recheck INR.

## 2011-05-29 ENCOUNTER — Other Ambulatory Visit: Payer: Self-pay

## 2011-05-29 ENCOUNTER — Other Ambulatory Visit: Payer: Self-pay | Admitting: Family Medicine

## 2011-05-29 DIAGNOSIS — O09899 Supervision of other high risk pregnancies, unspecified trimester: Secondary | ICD-10-CM

## 2011-05-29 MED ORDER — WARFARIN SODIUM 5 MG PO TABS: 15.0000 mg | ORAL_TABLET | Freq: Once | ORAL | Status: DC

## 2011-05-29 MED ORDER — WARFARIN SODIUM 1 MG PO TABS: 1.0000 mg | ORAL_TABLET | Freq: Every day | ORAL | Status: DC

## 2011-05-30 ENCOUNTER — Telehealth: Payer: Self-pay | Admitting: *Deleted

## 2011-05-30 NOTE — Telephone Encounter (Signed)
Message copied by Jill Side on Fri May 30, 2011  9:34 AM ------      Message from: Levie Heritage      Created: Thu May 29, 2011  4:22 PM       Patient needs to increase coumadin dose to 16mg  daily.  Recheck INR in 1 week.

## 2011-05-30 NOTE — Telephone Encounter (Signed)
Called pt with [name]. I was unable to leave a message because no voice mail available. We then called a friend and she will have Maria Cook call the clinic.

## 2011-05-30 NOTE — Telephone Encounter (Signed)
Contacted pt with hospital interpreter. I informed her of dosage change for the coumadin and she will need to pick up new Rx. I also informed her that she will need blood draw again on 06/05/11. Pt voiced understanding and agreement of all instructions.

## 2011-06-05 ENCOUNTER — Telehealth: Payer: Self-pay | Admitting: *Deleted

## 2011-06-05 ENCOUNTER — Other Ambulatory Visit: Payer: Self-pay

## 2011-06-05 NOTE — Telephone Encounter (Signed)
Patient called interpreter Cathi Roan, who relayed message to clinic that she did not start her increased dose of coumadin until Tuesday 06/03/11, was calling to see if she still needed blood draw today- per chart review should be one week- called Dr. Adrian Blackwater to notify patient did not start new dose until 06/03/11- when does he want next blood draw?  Per Dr. Adrian Blackwater draw in one week - called interpreter to call patient and notify to come 06/10/11 for blood draw between 8am and 12 noon- patient not sure what time she will arrive, but voices understanding of plan.

## 2011-06-10 ENCOUNTER — Other Ambulatory Visit: Payer: Self-pay

## 2011-06-10 ENCOUNTER — Telehealth: Payer: Self-pay | Admitting: *Deleted

## 2011-06-10 NOTE — Telephone Encounter (Signed)
Called pt with hospital interpreter- Maria Cook, re: missed lab appt for today.  Pt stated that she had a problem and could not get here today. She will come in tomorrow for lab draw. I reinforced the importance of weekly lab tests because of the medication (Coumadin) she is taking. Pt voiced understanding and will be here tomorrow before 1200.

## 2011-06-11 ENCOUNTER — Other Ambulatory Visit: Payer: Self-pay

## 2011-06-11 DIAGNOSIS — Z09 Encounter for follow-up examination after completed treatment for conditions other than malignant neoplasm: Secondary | ICD-10-CM

## 2011-06-12 ENCOUNTER — Telehealth: Payer: Self-pay | Admitting: *Deleted

## 2011-06-12 ENCOUNTER — Other Ambulatory Visit: Payer: Self-pay | Admitting: Family Medicine

## 2011-06-12 DIAGNOSIS — O09899 Supervision of other high risk pregnancies, unspecified trimester: Secondary | ICD-10-CM

## 2011-06-12 MED ORDER — WARFARIN SODIUM 5 MG PO TABS: ORAL_TABLET | ORAL | Status: DC

## 2011-06-12 MED ORDER — WARFARIN SODIUM 1 MG PO TABS: ORAL_TABLET | ORAL | Status: DC

## 2011-06-12 NOTE — Assessment & Plan Note (Signed)
INR 4.6.  Will skip next dose and change to Coumadin 15 mg three times a week (M, W, F) and 14mg  all other days

## 2011-06-12 NOTE — Telephone Encounter (Signed)
Message copied by Jill Side on Thu Jun 12, 2011  3:50 PM ------      Message from: Levie Heritage      Created: Thu Jun 12, 2011  1:01 PM       Patient needs to skip next dose.      Needs to decrease coumadin to 15mg  three times a week (monday, Wednesday, Friday) and 14mg  all other days.      Recheck coumadin next week.

## 2011-06-12 NOTE — Telephone Encounter (Signed)
Called pt with [facility] interpreter #  [phone].  Pt was instructed on new medication regimen. She repeated the correct dosage changes back to me and stated that she wrote it down. She will return on 06/19/11 for lab draw- INR

## 2011-06-19 ENCOUNTER — Other Ambulatory Visit: Payer: Self-pay | Admitting: Obstetrics and Gynecology

## 2011-06-19 ENCOUNTER — Other Ambulatory Visit: Payer: Self-pay

## 2011-06-19 DIAGNOSIS — O09899 Supervision of other high risk pregnancies, unspecified trimester: Secondary | ICD-10-CM

## 2011-06-19 LAB — PROTIME-INR: INR: 1.98 — ABNORMAL HIGH (ref <1.50)

## 2011-06-19 MED ORDER — WARFARIN SODIUM 1 MG PO TABS: ORAL_TABLET | ORAL | Status: DC

## 2011-06-23 ENCOUNTER — Telehealth: Payer: Self-pay | Admitting: *Deleted

## 2011-06-23 NOTE — Telephone Encounter (Signed)
Called pt w/Maria Cook- interpreter. Pt was informed that her blood level was good on 06/19/11 and she can stop taking the Warfarin medication effective today. Pt was asked if she has a PP appt scheduled. She stated that she is waiting for someone to call her w/appt. Pt will be contaced w/appt information. Pt voiced understanding.

## 2011-06-23 NOTE — Telephone Encounter (Signed)
Message copied by Jill Side on Mon Jun 23, 2011  9:08 AM ------      Message from: Levie Heritage      Created: Fri Jun 20, 2011  9:38 AM       INR okay.  Please call patient.  Okay to stop coumadin this coming Monday, which will be 6 weeks postpartum

## 2011-07-09 ENCOUNTER — Ambulatory Visit: Payer: Self-pay | Admitting: Obstetrics and Gynecology

## 2011-07-30 ENCOUNTER — Ambulatory Visit (INDEPENDENT_AMBULATORY_CARE_PROVIDER_SITE_OTHER): Payer: Self-pay | Admitting: Advanced Practice Midwife

## 2011-07-30 ENCOUNTER — Encounter: Payer: Self-pay | Admitting: Advanced Practice Midwife

## 2011-07-30 MED ORDER — PERMETHRIN 5 % EX CREA: TOPICAL_CREAM | Freq: Once | CUTANEOUS | Status: AC

## 2011-07-30 NOTE — Progress Notes (Signed)
  Subjective:     Maria Cook is a 36 y.o. female who presents for a postpartum visit. She is 3 months  postpartum following a spontaneous vaginal delivery. I have fully reviewed the prenatal and intrapartum course. She was on Lovenox and then Coumadin for history of DVT (stopped Coumadin at 6 weeks).  The delivery was at term. Outcome: spontaneous vaginal delivery. Anesthesia: none. Postpartum course has been uneventful. Baby's course has been unevetnful. Baby is feeding by breast. Bleeding no bleeding. Bowel function is normal. Bladder function is normal. Patient is sexually active. Contraception method is condoms. Postpartum depression screening: negative.  The following portions of the patient's history were reviewed and updated as appropriate: allergies, current medications, past family history, past medical history, past social history, past surgical history and problem list.  Review of Systems Pertinent items are noted in HPI.   Objective:    Temp(Src) 98.2 F (36.8 C) (Oral)  Ht 5\' 3"  (1.6 m)  Wt 138 lb 6.4 oz (62.778 kg)  BMI 24.52 kg/m2  LMP 07/20/2011  Breastfeeding? No  General:  alert and cooperative   Breasts:  inspection negative, no nipple discharge or bleeding, no masses or nodularity palpable  Lungs:   Heart:    Abdomen: soft, non-tender; bowel sounds normal; no masses,  no organomegaly   Vulva:  normal  Vagina: normal vagina  Cervix:  no lesions  Corpus: normal  Adnexa:  normal adnexa  Rectal Exam: Not performed.       Also noted multiple excoriated areas on hands, arms, and some on body. Some in between digits.  Appears possibly to be scabies.   Assessment:    Normal postpartum exam. Pap smear not done at today's visit.   Plan:    1. Contraception: condoms 2. Did discuss Implanon, but pt states will go to [facility].  3. Will Rx Permethrin.  If not better, needs to followup with family doctor 3. Follow up in: 1 year or as needed.

## 2011-07-30 NOTE — Progress Notes (Signed)
Pt c/o itching, no new medications just prenatal vitamins, no new soaps, detergents.

## 2011-11-24 ENCOUNTER — Ambulatory Visit (INDEPENDENT_AMBULATORY_CARE_PROVIDER_SITE_OTHER): Payer: Self-pay | Admitting: Family Medicine

## 2011-11-24 ENCOUNTER — Ambulatory Visit: Payer: Medicaid Other | Admitting: Family Medicine

## 2011-11-24 ENCOUNTER — Encounter: Payer: Self-pay | Admitting: Family Medicine

## 2011-11-24 VITALS — BP 123/75 | HR 78 | Temp 98.1°F | Ht 62.0 in | Wt 140.0 lb

## 2011-11-24 DIAGNOSIS — M79606 Pain in leg, unspecified: Secondary | ICD-10-CM

## 2011-11-24 DIAGNOSIS — M79609 Pain in unspecified limb: Secondary | ICD-10-CM

## 2011-11-24 DIAGNOSIS — R21 Rash and other nonspecific skin eruption: Secondary | ICD-10-CM | POA: Insufficient documentation

## 2011-11-24 DIAGNOSIS — O09899 Supervision of other high risk pregnancies, unspecified trimester: Secondary | ICD-10-CM

## 2011-11-24 MED ORDER — NAPROXEN 500 MG PO TABS: 500.0000 mg | ORAL_TABLET | Freq: Two times a day (BID) | ORAL | Status: AC

## 2011-11-24 NOTE — Progress Notes (Signed)
  Subjective:    Patient ID: Maria Cook, female    DOB: Dec 02, 1975, 36 y.o.   MRN: [id number]  HPI New patient appt, here to establish care.  Primary concern is anticoagulation  6 years ago, had a blood clot in leg, was on lovenox at the time.  Most recent pregnancy, (baby is 68 months old) was on "pills" because she was having trouble getting shots.  Has not gotten anything since this birth - [facility] [place], that she no longer needed anticoagulation.  She is concerned as she has had had chronic leg pain since that time 6 years ago)  And thinks she was told she need to be on long term anticoagulation. Does not know any of any inherited coagulation disorder.  Described pain as starting on right leg but is now over entire right and letf leg down to toes.  Describes as numb and tingling.  No signs of claudication.  Intermittent.  No known triggers.  At the end of the visits,  States she has also had a rash on her body for past week.  No new foods, soaps, or cosmetics.  Itchy.  Has take an allergy medicine from [place]. Review of Systems    I have reviewed patient's  PMH, FH, and Social history and Medications as related to this visit. Patient Information Form: Screening and ROS  AUDIT-C Score: 0 Do you feel safe in relationships? yes PHQ-2:positive one  Review of Symptoms  General:  Negative for nexplained weight loss, fever Skin: Negative for new or changing mole, sore that won't heal HEENT: Negative for trouble hearing, trouble seeing, ringing in ears, mouth sores, hoarseness, change in voice, dysphagia. CV:  Negative for chest pain, dyspnea, edema, palpitations Resp: Negative for cough, dyspnea, hemoptysis GI: Negative for nausea, vomiting, diarrhea, constipation, abdominal pain, melena, hematochezia. GU: Negative for dysuria, incontinence, urinary hesitance, hematuria, vaginal or penile discharge, polyuria, sexual  difficulty, lumps in testicle or breasts MSK: Negative for muscle cramps or aches, joint pain or swelling Neuro: Negative for weakness, dizziness, passing out/fainting Psych: Negative for depression, anxiety, memory problems  Positive for stress, chronic headaches Objective:   Physical Exam GEN: Alert & Oriented, No acute distress.  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   CV:  Regular Rate & Rhythm, no murmur Respiratory:  Normal work of breathing, CTAB Abd:  + BS, soft, no tenderness to palpation Ext: no pre-tibial edema Skin:  Papular fine red rash on chest.no vesicles Psych:  Anxious appearing Ext; no edema.  Neg straight leg. No pain on palpation of legs. Normal sensation to light touch        Assessment & Plan:

## 2011-11-24 NOTE — Assessment & Plan Note (Signed)
Advised 2nd gen antihistamine.

## 2011-11-24 NOTE — Patient Instructions (Addendum)
cetririzine or loratadine for allergy  Make follow-up appt when you get orange card.  Will get fasting bloodwork at that time

## 2011-11-24 NOTE — Assessment & Plan Note (Signed)
Will request records from [facility] and [facility] to evaluate prior testing for coagulation disorder.  I suspect likely normal given she was discontinued on lovenox after each pregnancy.

## 2011-11-24 NOTE — Assessment & Plan Note (Addendum)
Chronic leg pain, per history sounds neuropathic vs fibromyalgia.  No particular distribution of pain.  No evidence of edema.  Will treat empirically with naprosyn.  Will have her return for tsh, fasting bloodwork (hx of gest dm), CBC, once she gets orange card.

## 2011-12-12 ENCOUNTER — Encounter: Payer: Self-pay | Admitting: Family Medicine

## 2011-12-17 ENCOUNTER — Emergency Department (INDEPENDENT_AMBULATORY_CARE_PROVIDER_SITE_OTHER)
Admission: EM | Admit: 2011-12-17 | Discharge: 2011-12-17 | Disposition: A | Discharge status: Home / Self Care | Payer: Self-pay | Source: Home / Self Care

## 2011-12-17 ENCOUNTER — Encounter (HOSPITAL_COMMUNITY): Payer: Self-pay

## 2011-12-17 DIAGNOSIS — M549 Dorsalgia, unspecified: Secondary | ICD-10-CM

## 2011-12-17 DIAGNOSIS — R21 Rash and other nonspecific skin eruption: Secondary | ICD-10-CM

## 2011-12-17 DIAGNOSIS — M542 Cervicalgia: Secondary | ICD-10-CM

## 2011-12-17 DIAGNOSIS — T148XXA Other injury of unspecified body region, initial encounter: Secondary | ICD-10-CM

## 2011-12-17 MED ORDER — TRIAMCINOLONE ACETONIDE 40 MG/ML IJ SUSP
INTRAMUSCULAR | Status: AC
  Filled 2011-12-17: qty 5

## 2011-12-17 MED ORDER — CYCLOBENZAPRINE HCL 10 MG PO TABS: 10.0000 mg | ORAL_TABLET | Freq: Two times a day (BID) | ORAL | Status: AC | PRN

## 2011-12-17 MED ORDER — METHYLPREDNISOLONE 4 MG PO KIT: PACK | ORAL | Status: AC

## 2011-12-17 MED ORDER — TRIAMCINOLONE ACETONIDE 40 MG/ML IJ SUSP
60.0000 mg | Freq: Once | INTRAMUSCULAR | Status: AC
  Administered 2011-12-17: 60 mg via INTRAMUSCULAR

## 2011-12-17 MED ORDER — KETOROLAC TROMETHAMINE 60 MG/2ML IM SOLN: 60.0000 mg | Freq: Once | INTRAMUSCULAR | Status: DC

## 2011-12-17 NOTE — ED Provider Notes (Signed)
History     CSN: [id number]  Arrival date & time 12/17/11  1535   None     Chief Complaint  Patient presents with  . Fall  . Back Pain    (Consider location/radiation/quality/duration/timing/severity/associated sxs/prior treatment) The history is provided by the patient and a relative. The history is limited by a language barrier. A language interpreter was used.  Complains of neck and upper back pain after slipping on a banana peel resulting in fall yesterday, described as intermittent sharp in nature. The pain is aggravated with sitting, with no radiation down the extremities.  Denies history of back problems. There is no associated numbness in the extremities. Has taken no medication for pain relief.  Denies urinary symptoms. No red flags such as fevers, age >87, h/o trauma with bony tenderness, neurological deficits, h/o CA, unexplained weight loss, pain worse at night, pain at rest,  h/o prolonged steroid use or h/o osteopenia.   Additionally she complains of a pruritic rash. Location: bilateral forearms, hands and face  Onset: 2 wk ago   Course: unchanged Self-treated with: noting            Improvement with treatment: no History Itching: yes  Tenderness: no  New medications/antibiotics: no  Pet exposure: no  Recent travel or tropical exposure: no  New soaps, shampoos, detergent, clothing: no Tick/insect exposure: no  Red Flags Feeling ill: no Fever:no Facial/tongue swelling/difficulty breathing:  no  Diabetic or immunocompromised: no Seen at primary care provider for same with no improvement in symptoms.   Patient's last menstrual period was 11/29/2011.  Past Medical History  Diagnosis Date  . Gestational diabetes 02/24/2011  . Prior pregnancy complicated by DVT, antepartum 02/24/2011    History reviewed. No pertinent past surgical history.  Family History  Problem Relation Age of Onset  . Depression Father   . Diabetes Neg Hx   . Cancer Neg Hx   .  Hypertension Neg Hx     History  Substance Use Topics  . Smoking status: Never Smoker   . Smokeless tobacco: Never Used  . Alcohol Use: No    OB History    Grav Para Term Preterm Abortions TAB SAB Ect Mult Living   6 6 6  0 0 0 0 0 0 6      Review of Systems  Constitutional: Negative.   HENT: Negative.   Respiratory: Negative.   Cardiovascular: Negative.   Musculoskeletal: Positive for back pain. Negative for myalgias, joint swelling and gait problem.  Skin: Positive for rash. Negative for color change, pallor and wound.  Neurological: Negative.   Hematological: Negative for adenopathy.    Allergies  Review of patient's allergies indicates no known allergies.  Home Medications   Current Outpatient Rx  Name Route Sig Dispense Refill  . METHYLPREDNISOLONE 4 MG PO KIT  follow package directions 21 tablet 0  . NAPROXEN 500 MG PO TABS Oral Take 1 tablet (500 mg total) by mouth 2 (two) times daily with a meal. 60 tablet 0    BP 106/74  Pulse 79  Temp 98.8 F (37.1 C) (Oral)  Resp 17  SpO2 96%  LMP 11/29/2011  Physical Exam  Nursing note and vitals reviewed. Constitutional: She is oriented to person, place, and time. Vital signs are normal. She appears well-developed and well-nourished. She is active and cooperative.  HENT:  Head: Normocephalic.  Right Ear: Hearing, tympanic membrane, external ear and ear canal normal.  Left Ear: Hearing, tympanic membrane, external ear and ear  canal normal.  Nose: Nose normal.  Mouth/Throat: Uvula is midline, oropharynx is clear and moist and mucous membranes are normal.  Eyes: Conjunctivae are normal. Pupils are equal, round, and reactive to light. No scleral icterus.  Neck: Trachea normal and normal range of motion. Neck supple. Muscular tenderness present.         Full passive ROM with Right cervical paraspinal tenderness  Cardiovascular: Normal rate, regular rhythm, normal heart sounds, intact distal pulses and normal pulses.    Pulmonary/Chest: Effort normal and breath sounds normal.  Musculoskeletal:       Cervical back: She exhibits tenderness and spasm. She exhibits normal range of motion, no bony tenderness, no swelling, no edema, no deformity and no pain.       Thoracic back: She exhibits tenderness and spasm. She exhibits normal range of motion, no bony tenderness, no swelling, no edema, no deformity and no pain.       Back:  Neurological: She is alert and oriented to person, place, and time. She has normal strength. No cranial nerve deficit or sensory deficit. Coordination and gait normal. GCS eye subscore is 4. GCS verbal subscore is 5. GCS motor subscore is 6.       Bilateral upper and lower body strength normal, equal hand grips, sensation intact distal extremities.  Skin: Skin is warm and dry. Rash noted.       Diffuse papular rash to face, neck, anterior chest and bilateral arms.  Psychiatric: She has a normal mood and affect. Her speech is normal and behavior is normal. Judgment and thought content normal. Cognition and memory are normal.    ED Course  Procedures (including critical care time)  Labs Reviewed - No data to display No results found.   1. Muscle strain   2. Back pain   3. Neck pain   4. Rash and nonspecific skin eruption       MDM  Kenalog 60mg  IM administered in office for pain in addition to noted persistent rash.  Rest, intermittent application of cold packs (later, may switch to heat, but do not sleep on heating pad), analgesics and muscle relaxants as recommended. Imaging not indicated at this time.   Cool showers; avoid heat, sunlight and anything that makes condition worse.  Continue Zyrtec for at least seven days.  Begin Medrol dosepak tomorrow-follow instructions.  RTC if symptoms do not improve or begin to have problems swallowing, breathing or significant change in condition.        Johnsie Kindred, NP 12/17/11 1741

## 2011-12-17 NOTE — ED Notes (Signed)
Pt states she slipped on banana peel yesterday and fell onto concrete surface- states she landed on her back. C/o pain in mid and upper back.

## 2011-12-17 NOTE — ED Provider Notes (Signed)
Medical screening examination/treatment/procedure(s) were performed by non-physician practitioner and as supervising physician I was immediately available for consultation/collaboration.  Leslee Home, M.D.   Reuben Likes, MD 12/17/11 2121

## 2012-04-29 ENCOUNTER — Ambulatory Visit: Payer: Self-pay | Admitting: Family Medicine

## 2012-05-06 ENCOUNTER — Ambulatory Visit: Payer: Self-pay | Admitting: Family Medicine

## 2012-05-08 ENCOUNTER — Emergency Department (HOSPITAL_COMMUNITY)
Admission: EM | Admit: 2012-05-08 | Discharge: 2012-05-08 | Disposition: A | Discharge status: Home / Self Care | Payer: Self-pay | Attending: Emergency Medicine | Admitting: Emergency Medicine

## 2012-05-08 ENCOUNTER — Encounter (HOSPITAL_COMMUNITY): Payer: Self-pay | Admitting: *Deleted

## 2012-05-08 DIAGNOSIS — N390 Urinary tract infection, site not specified: Secondary | ICD-10-CM

## 2012-05-08 DIAGNOSIS — Z862 Personal history of diseases of the blood and blood-forming organs and certain disorders involving the immune mechanism: Secondary | ICD-10-CM | POA: Insufficient documentation

## 2012-05-08 DIAGNOSIS — R6883 Chills (without fever): Secondary | ICD-10-CM | POA: Insufficient documentation

## 2012-05-08 DIAGNOSIS — R630 Anorexia: Secondary | ICD-10-CM | POA: Insufficient documentation

## 2012-05-08 DIAGNOSIS — K5289 Other specified noninfective gastroenteritis and colitis: Secondary | ICD-10-CM | POA: Insufficient documentation

## 2012-05-08 DIAGNOSIS — Z3202 Encounter for pregnancy test, result negative: Secondary | ICD-10-CM | POA: Insufficient documentation

## 2012-05-08 DIAGNOSIS — R197 Diarrhea, unspecified: Secondary | ICD-10-CM | POA: Insufficient documentation

## 2012-05-08 DIAGNOSIS — R42 Dizziness and giddiness: Secondary | ICD-10-CM | POA: Insufficient documentation

## 2012-05-08 DIAGNOSIS — R300 Dysuria: Secondary | ICD-10-CM | POA: Insufficient documentation

## 2012-05-08 DIAGNOSIS — R51 Headache: Secondary | ICD-10-CM

## 2012-05-08 DIAGNOSIS — K529 Noninfective gastroenteritis and colitis, unspecified: Secondary | ICD-10-CM

## 2012-05-08 DIAGNOSIS — IMO0001 Reserved for inherently not codable concepts without codable children: Secondary | ICD-10-CM | POA: Insufficient documentation

## 2012-05-08 HISTORY — DX: Anemia, unspecified: D64.9

## 2012-05-08 LAB — URINALYSIS, ROUTINE W REFLEX MICROSCOPIC
Bilirubin Urine: NEGATIVE
Glucose, UA: NEGATIVE mg/dL
Hgb urine dipstick: NEGATIVE
Ketones, ur: NEGATIVE mg/dL
Nitrite: NEGATIVE
Protein, ur: NEGATIVE mg/dL
Specific Gravity, Urine: 1.01 (ref 1.005–1.030)
Urobilinogen, UA: 0.2 mg/dL (ref 0.0–1.0)
pH: 7 (ref 5.0–8.0)

## 2012-05-08 LAB — CBC WITH DIFFERENTIAL/PLATELET
Basophils Absolute: 0 10*3/uL (ref 0.0–0.1)
Basophils Relative: 0 % (ref 0–1)
Eosinophils Absolute: 0 10*3/uL (ref 0.0–0.7)
Eosinophils Relative: 0 % (ref 0–5)
HCT: 39 % (ref 36.0–46.0)
Hemoglobin: 13 g/dL (ref 12.0–15.0)
Lymphocytes Relative: 13 % (ref 12–46)
Lymphs Abs: 1.4 10*3/uL (ref 0.7–4.0)
MCH: 30.7 pg (ref 26.0–34.0)
MCHC: 33.3 g/dL (ref 30.0–36.0)
MCV: 92 fL (ref 78.0–100.0)
Monocytes Absolute: 0.4 10*3/uL (ref 0.1–1.0)
Monocytes Relative: 4 % (ref 3–12)
Neutro Abs: 8.6 10*3/uL — ABNORMAL HIGH (ref 1.7–7.7)
Neutrophils Relative %: 83 % — ABNORMAL HIGH (ref 43–77)
Platelets: 276 10*3/uL (ref 150–400)
RBC: 4.24 MIL/uL (ref 3.87–5.11)
RDW: 13.2 % (ref 11.5–15.5)
WBC: 10.4 10*3/uL (ref 4.0–10.5)

## 2012-05-08 LAB — URINE MICROSCOPIC-ADD ON

## 2012-05-08 LAB — COMPREHENSIVE METABOLIC PANEL
ALT: 23 U/L (ref 0–35)
AST: 21 U/L (ref 0–37)
Albumin: 4.1 g/dL (ref 3.5–5.2)
CO2: 25 mEq/L (ref 19–32)
Calcium: 9.7 mg/dL (ref 8.4–10.5)
Chloride: 101 mEq/L (ref 96–112)
Creatinine, Ser: 0.56 mg/dL (ref 0.50–1.10)
GFR calc non Af Amer: >90 mL/min (ref >90)
Sodium: 136 mEq/L (ref 135–145)
Total Bilirubin: 0.2 mg/dL — ABNORMAL LOW (ref 0.3–1.2)

## 2012-05-08 LAB — POCT PREGNANCY, URINE: Preg Test, Ur: NEGATIVE

## 2012-05-08 LAB — LIPASE, BLOOD: Lipase: 18 U/L (ref 11–59)

## 2012-05-08 MED ORDER — KETOROLAC TROMETHAMINE 30 MG/ML IJ SOLN
30.0000 mg | Freq: Once | INTRAMUSCULAR | Status: AC
  Administered 2012-05-08: 30 mg via INTRAVENOUS
  Filled 2012-05-08: qty 1

## 2012-05-08 MED ORDER — CEPHALEXIN 500 MG PO CAPS: 500.0000 mg | ORAL_CAPSULE | Freq: Four times a day (QID) | ORAL | Status: DC

## 2012-05-08 MED ORDER — SODIUM CHLORIDE 0.9 % IV BOLUS (SEPSIS)
1000.0000 mL | Freq: Once | INTRAVENOUS | Status: AC
  Administered 2012-05-08: 1000 mL via INTRAVENOUS

## 2012-05-08 MED ORDER — PROMETHAZINE HCL 25 MG PO TABS: 25.0000 mg | ORAL_TABLET | Freq: Four times a day (QID) | ORAL | Status: DC | PRN

## 2012-05-08 MED ORDER — HYDROCODONE-ACETAMINOPHEN 5-325 MG PO TABS: 2.0000 | ORAL_TABLET | ORAL | Status: DC | PRN

## 2012-05-08 NOTE — ED Notes (Signed)
Per the spanish interpretor.  Patient reports onset of pain in abdomen around her belly button at 0500.  She also reports dizziness.  She reports she has had diarrhea since the onset of pain.  No reported fever

## 2012-05-08 NOTE — ED Notes (Signed)
PI # [id number] for spanish .

## 2012-05-09 LAB — URINE CULTURE

## 2012-05-14 NOTE — ED Provider Notes (Signed)
History     CSN: [id number]  Arrival date & time 05/08/12  1024   First MD Initiated Contact with Patient 05/08/12 1127      Chief Complaint  Patient presents with  . Abdominal Pain  . Diarrhea    (Consider location/radiation/quality/duration/timing/severity/associated sxs/prior treatment) HPI Comments: Patient awoke form sleep at 500 AM with abdominal pain and watery diarrhea.  She has had associated chills and myalgias. No nausea or vomiting. She has had dysuria and light headedness. Patient denies melena, hematochezia. She denies contacts with similar sxs, ingestion of suspect foods or water, history of similar sxs, recent foreign travel . Denies fevers, chills, myalgias, arthralgias. Denies DOE, SOB, chest tightness or pressure, radiation to left arm, jaw or back, or diaphoresis. Denies flank pain, suprapubic pain, frequency, urgency, or hematuria.    Patient is a 37 y.o. female presenting with abdominal pain and diarrhea. The history is provided by the patient. The history is limited by a language barrier. A language interpreter was used.  Abdominal Pain The primary symptoms of the illness include abdominal pain, diarrhea and dysuria. The primary symptoms of the illness do not include fever, fatigue, shortness of breath, nausea, vomiting, hematemesis, hematochezia, vaginal discharge or vaginal bleeding. The current episode started 3 to 5 hours ago. The onset of the illness was sudden. The problem has been gradually improving.  The abdominal pain began 3 to 5 hours ago. The pain came on suddenly. The abdominal pain has been gradually improving since its onset. The abdominal pain is generalized. The abdominal pain does not radiate. The severity of the abdominal pain is 6/10. The abdominal pain is relieved by nothing.  The diarrhea began today. The diarrhea is watery. The diarrhea occurs more than 10 times per day.  The dysuria is not associated with hematuria, frequency, urgency or  vaginal pain.  The patient states that she believes she is currently not pregnant. Additional symptoms associated with the illness include chills and anorexia. Symptoms associated with the illness do not include diaphoresis, heartburn, constipation, urgency, hematuria, frequency or back pain. Significant associated medical issues do not include PUD, GERD, inflammatory bowel disease, diabetes, sickle cell disease, gallstones, liver disease, substance abuse, diverticulitis, HIV or cardiac disease.  Diarrhea The primary symptoms include abdominal pain, diarrhea, dysuria and myalgias. Primary symptoms do not include fever, weight loss, fatigue, nausea, vomiting, melena, hematemesis, jaundice, hematochezia, arthralgias or rash. The illness began today.  The dysuria is not associated with hematuria, frequency, urgency or vaginal pain.  The illness is also significant for chills and anorexia. The illness does not include constipation or back pain. Associated medical issues do not include inflammatory bowel disease, GERD, gallstones, liver disease, alcohol abuse, PUD, gastric bypass, bowel resection, irritable bowel syndrome, hemorrhoids or diverticulitis.    Past Medical History  Diagnosis Date  . Anemia     History reviewed. No pertinent past surgical history.  No family history on file.  History  Substance Use Topics  . Smoking status: Never Smoker   . Smokeless tobacco: Not on file  . Alcohol Use: No    OB History    Grav Para Term Preterm Abortions TAB SAB Ect Mult Living                  Review of Systems  Constitutional: Positive for chills. Negative for fever, weight loss, diaphoresis and fatigue.  HENT: Negative.   Eyes: Negative.   Respiratory: Negative for shortness of breath.   Cardiovascular: Negative.   Gastrointestinal:  Positive for abdominal pain, diarrhea and anorexia. Negative for heartburn, nausea, vomiting, constipation, melena, hematochezia, hematemesis and jaundice.    Genitourinary: Positive for dysuria. Negative for urgency, frequency, hematuria, flank pain, vaginal bleeding, vaginal discharge, difficulty urinating, vaginal pain and pelvic pain.  Musculoskeletal: Positive for myalgias. Negative for back pain and arthralgias.  Skin: Negative for rash.  Neurological: Positive for light-headedness. Negative for dizziness.  All other systems reviewed and are negative.    Allergies  Review of patient's allergies indicates no known allergies.  Home Medications   Current Outpatient Rx  Name  Route  Sig  Dispense  Refill  . CEPHALEXIN 500 MG PO CAPS   Oral   Take 1 capsule (500 mg total) by mouth 4 (four) times daily.   40 capsule   0   . HYDROCODONE-ACETAMINOPHEN 5-325 MG PO TABS   Oral   Take 2 tablets by mouth every 4 (four) hours as needed for pain.   6 tablet   0   . PROMETHAZINE HCL 25 MG PO TABS   Oral   Take 1 tablet (25 mg total) by mouth every 6 (six) hours as needed for nausea.   30 tablet   0     BP 108/77  Pulse 80  Temp 98.3 F (36.8 C) (Oral)  Resp 16  Ht 5\' 2"  (1.575 m)  Wt 132 lb (59.875 kg)  BMI 24.14 kg/m2  SpO2 100%  Physical Exam Physical Exam  Nursing note and vitals reviewed. Constitutional: She is oriented to person, place, and time. She appears well-developed and well-nourished. Patient appears uncomfortable. HENT:  Head: Normocephalic and atraumatic.  Eyes: Conjunctivae normal and EOM are normal. Pupils are equal, round, and reactive to light. No scleral icterus.  Neck: Normal range of motion.  Cardiovascular: Normal rate, regular rhythm and normal heart sounds.  Exam reveals no gallop and no friction rub.   No murmur heard. Pulmonary/Chest: Effort normal and breath sounds normal. No respiratory distress.  Abdominal: Soft. Bowel sounds are normal. She exhibits no distension and no mass. There is diffuse tenderness. There is no guarding.  Neurological: She is alert and oriented to person, place, and  time.  Skin: Skin is warm and dry. She is not diaphoretic.    ED Course  Procedures (including critical care time)  Labs Reviewed  CBC WITH DIFFERENTIAL - Abnormal; Notable for the following:    Neutrophils Relative 83 (*)     Neutro Abs 8.6 (*)     All other components within normal limits  COMPREHENSIVE METABOLIC PANEL - Abnormal; Notable for the following:    Glucose, Bld 104 (*)     Total Protein 9.0 (*)     Total Bilirubin 0.2 (*)     All other components within normal limits  URINALYSIS, ROUTINE W REFLEX MICROSCOPIC - Abnormal; Notable for the following:    APPearance CLOUDY (*)     Leukocytes, UA LARGE (*)     All other components within normal limits  URINE MICROSCOPIC-ADD ON - Abnormal; Notable for the following:    Squamous Epithelial / LPF MANY (*)     Bacteria, UA MANY (*)     All other components within normal limits  LIPASE, BLOOD  POCT PREGNANCY, URINE  URINE CULTURE  LAB REPORT - SCANNED   No results found.   1. Gastroenteritis   2. Headache   3. UTI (lower urinary tract infection)       MDM   Filed Vitals:   05/08/12 1031  05/08/12 1035 05/08/12 1634  BP: 131/80 131/80 108/77  Pulse: 86 86 80  Temp: 97.7 F (36.5 C) 97.7 F (36.5 C) 98.3 F (36.8 C)  TempSrc: Oral Oral Oral  Resp: 16 16 16   Height:  5\' 2"  (1.575 m)   Weight:  132 lb (59.875 kg)   SpO2: 100% 100% 100%   Patient with normal vitals.  She hashad complete resolution of syumptoms after fluid bolus and ketorlac. Urine may reflect contamination, but due symptoms, will treat patient for UTI and discahrge with sxs treatment. Patient with symptoms consistent with viral gastroenteritis.  Vitals are stable, no fever.  No signs of dehydration, tolerating PO fluids.  Lungs are clear.  No focal abdominal pain, no concern for appendicitis, cholecystitis, pancreatitis, ruptured viscus, , kidney stone, or any other abdominal etiology.  Supportive therapy indicated with return if symptoms worsen.   Patient counseled.        Arthor Captain, PA-C 05/20/12 0954  Arthor Captain, PA-C 05/20/12 [phone]

## 2012-05-21 NOTE — ED Provider Notes (Signed)
Medical screening examination/treatment/procedure(s) were performed by non-physician practitioner and as supervising physician I was immediately available for consultation/collaboration.  Gilda Crease, MD 05/21/12 [phone]

## 2012-05-24 ENCOUNTER — Ambulatory Visit (INDEPENDENT_AMBULATORY_CARE_PROVIDER_SITE_OTHER): Payer: Self-pay | Admitting: Family Medicine

## 2012-05-24 VITALS — BP 118/81 | HR 71 | Temp 97.8°F | Ht 62.0 in | Wt 146.0 lb

## 2012-05-24 DIAGNOSIS — R519 Headache, unspecified: Secondary | ICD-10-CM | POA: Insufficient documentation

## 2012-05-24 DIAGNOSIS — R109 Unspecified abdominal pain: Secondary | ICD-10-CM | POA: Insufficient documentation

## 2012-05-24 DIAGNOSIS — R51 Headache: Secondary | ICD-10-CM

## 2012-05-24 MED ORDER — AMITRIPTYLINE HCL 10 MG PO TABS: 10.0000 mg | ORAL_TABLET | Freq: Every day | ORAL | Status: DC

## 2012-05-24 NOTE — Assessment & Plan Note (Signed)
Chronic, Associated with periods of stress, which is associated with increased abdominal pain and insomnia.  Will start amitriptyline . Advised on tapering back daily OTC analgesic use as there may be a component of medication overuse as well.  Will follow-up in 2-4 weeks

## 2012-05-24 NOTE — Assessment & Plan Note (Signed)
Has history of "sensetive" bowels since childhood.  Very related to periods of stress. Perhaps a component of dyspepsia as well.  Will check CBC, CMEt, TSH, Lipase (given intermittent radiation to back).  Patient will return tomorrow for this to get fasting labwork and avoid two blood draws.  Will start amitriptyline and follow-up in 2-4 weeks

## 2012-05-24 NOTE — Progress Notes (Signed)
  Subjective:    Patient ID: Maria Cook, female    DOB: 1975/08/21, 37 y.o.   MRN: [id number]  HPI Headaches- off and on x 3 months.  Taking tylenol daily- up to 3x per day.  Has always had problems with headaches, but have become more frequent.  Now daily.  Has not had medical care for headaches in her past.  Typically triggered by emotional stress and abdominal pain.  States feels her "nerves" are worse.  No change in vision.  Has history of bell's Palsy twice but currently no numbness or tingling.   Abdominal pain:  Has been going on for at least a year but notes since she was a child, mom has had to rub her stomach due to pain.   Feels bloated, at times changes and goes to her lower back.  Worse when drinking coffee or hasn't eaten in a long while.  Cold milk makes it better.  Points to upper abdomen and mid back.   Currently having mild pain.    Stress:  Husband has been ill since April and this has caused a great deal of emotional or financial stress.  Has children in [place] who are involved in gangs which causes her a great deal of worry. Has difficulty sleeping 3-4 hours a night, wakes up early.  Feels tired when she wakes up, feels tired but cannot take a nap during the day.  Feels she is gaining weight.    States feels hopeless, but keeps going on to be strong for her family.  Uses knitting as stress relief.   I have reviewed patient's  PMH, FH, and Social history and Medications as related to this visit. Hx of dvt Review of SystemsNo weight loss, fever.  No nausea, vomiting, or diarrhea.  But notes when she gets very anxious, gets diarrhea.       Objective:   Physical Exam GEN: Alert & Oriented, No acute distress, here with interpreter CV:  Regular Rate & Rhythm, no murmur Respiratory:  Normal work of breathing, CTAB Abd:  + BS, soft, no tenderness to palpation Ext: no pre-tibial edema Psych: flat affect.  Neuro: gait normal.  Grossly normal       Assessment & Plan:

## 2012-05-24 NOTE — Patient Instructions (Addendum)
Make appointment for fasting labwork  New medicine- amitriptyline take one tablets before bedtime.  Can go up to two tablets a night.  Try to wean back pain medicines- taking it daily can trigger more headaches  Follow-up in 2-4 weeks

## 2012-05-24 NOTE — Addendum Note (Signed)
Addended by: Macy Mis on: 05/24/2012 09:16 AM   Modules accepted: Orders

## 2012-05-25 ENCOUNTER — Other Ambulatory Visit (INDEPENDENT_AMBULATORY_CARE_PROVIDER_SITE_OTHER): Payer: Self-pay

## 2012-05-25 DIAGNOSIS — R109 Unspecified abdominal pain: Secondary | ICD-10-CM

## 2012-05-25 LAB — COMPREHENSIVE METABOLIC PANEL
AST: 21 U/L (ref 0–37)
Albumin: 4.2 g/dL (ref 3.5–5.2)
BUN: 11 mg/dL (ref 6–23)
Calcium: 9.5 mg/dL (ref 8.4–10.5)
Chloride: 106 mEq/L (ref 96–112)
Glucose, Bld: 80 mg/dL (ref 70–99)
Potassium: 4.2 mEq/L (ref 3.5–5.3)
Sodium: 138 mEq/L (ref 135–145)
Total Protein: 8 g/dL (ref 6.0–8.3)

## 2012-05-25 LAB — CBC WITH DIFFERENTIAL/PLATELET
Basophils Absolute: 0 10*3/uL (ref 0.0–0.1)
HCT: 38.6 % (ref 36.0–46.0)
Hemoglobin: 13 g/dL (ref 12.0–15.0)
Lymphocytes Relative: 43 % (ref 12–46)
Monocytes Absolute: 0.4 10*3/uL (ref 0.1–1.0)
Monocytes Relative: 7 % (ref 3–12)
Neutro Abs: 2.2 10*3/uL (ref 1.7–7.7)
Neutrophils Relative %: 47 % (ref 43–77)
WBC: 4.9 10*3/uL (ref 4.0–10.5)

## 2012-05-25 LAB — LIPID PANEL
LDL Cholesterol: 74 mg/dL (ref 0–99)
Triglycerides: 69 mg/dL (ref <150)
VLDL: 14 mg/dL (ref 0–40)

## 2012-05-25 LAB — TSH: TSH: 1.262 u[IU]/mL (ref 0.350–4.500)

## 2012-05-25 NOTE — Progress Notes (Signed)
CMP,FLP,TSH,CBC WITH DIFF,LIPASE AND UPT DONE TODAY Maria Cook

## 2012-05-26 ENCOUNTER — Telehealth: Payer: Self-pay | Admitting: Family Medicine

## 2012-05-26 NOTE — Telephone Encounter (Signed)
Please call and let patient know all of her labs are normal (Spanish speaking)

## 2012-05-27 NOTE — Telephone Encounter (Signed)
Pt is aware about labs result. °MJ  °

## 2012-06-03 ENCOUNTER — Other Ambulatory Visit (HOSPITAL_COMMUNITY): Payer: Self-pay | Admitting: Family Medicine

## 2012-06-03 DIAGNOSIS — R609 Edema, unspecified: Secondary | ICD-10-CM

## 2012-06-03 DIAGNOSIS — R52 Pain, unspecified: Secondary | ICD-10-CM

## 2012-06-04 ENCOUNTER — Ambulatory Visit (HOSPITAL_COMMUNITY)
Admission: RE | Admit: 2012-06-04 | Discharge: 2012-06-04 | Disposition: A | Discharge status: Home / Self Care | Payer: No Typology Code available for payment source | Source: Ambulatory Visit | Attending: Family Medicine | Admitting: Family Medicine

## 2012-06-04 DIAGNOSIS — M79609 Pain in unspecified limb: Secondary | ICD-10-CM | POA: Insufficient documentation

## 2012-06-04 DIAGNOSIS — R52 Pain, unspecified: Secondary | ICD-10-CM

## 2012-06-04 DIAGNOSIS — M7989 Other specified soft tissue disorders: Secondary | ICD-10-CM

## 2012-06-04 DIAGNOSIS — R609 Edema, unspecified: Secondary | ICD-10-CM

## 2012-06-04 NOTE — Progress Notes (Signed)
*  PRELIMINARY RESULTS* Vascular Ultrasound Left lower extremity venous duplex has been completed.  Preliminary findings: Left:  No evidence of DVT, superficial thrombosis, or Baker's cyst.  Called report to Dr. Smith Mince.   Farrel Demark, RDMS, RVT 06/04/2012, 9:46 AM

## 2012-06-15 ENCOUNTER — Ambulatory Visit: Payer: Self-pay | Admitting: Family Medicine

## 2012-06-21 ENCOUNTER — Ambulatory Visit: Payer: Self-pay | Admitting: Family Medicine

## 2013-03-01 ENCOUNTER — Emergency Department (HOSPITAL_COMMUNITY)
Admission: EM | Admit: 2013-03-01 | Discharge: 2013-03-01 | Disposition: A | Discharge status: Home / Self Care | Payer: No Typology Code available for payment source | Attending: Emergency Medicine | Admitting: Emergency Medicine

## 2013-03-01 ENCOUNTER — Encounter (HOSPITAL_COMMUNITY): Payer: Self-pay | Admitting: Emergency Medicine

## 2013-03-01 DIAGNOSIS — Z8742 Personal history of other diseases of the female genital tract: Secondary | ICD-10-CM | POA: Insufficient documentation

## 2013-03-01 DIAGNOSIS — R42 Dizziness and giddiness: Secondary | ICD-10-CM | POA: Insufficient documentation

## 2013-03-01 DIAGNOSIS — Z79899 Other long term (current) drug therapy: Secondary | ICD-10-CM | POA: Insufficient documentation

## 2013-03-01 DIAGNOSIS — S40029A Contusion of unspecified upper arm, initial encounter: Secondary | ICD-10-CM | POA: Insufficient documentation

## 2013-03-01 DIAGNOSIS — M79601 Pain in right arm: Secondary | ICD-10-CM

## 2013-03-01 DIAGNOSIS — Z86718 Personal history of other venous thrombosis and embolism: Secondary | ICD-10-CM | POA: Insufficient documentation

## 2013-03-01 DIAGNOSIS — T7492XA Unspecified child maltreatment, confirmed, initial encounter: Secondary | ICD-10-CM | POA: Insufficient documentation

## 2013-03-01 DIAGNOSIS — IMO0002 Reserved for concepts with insufficient information to code with codable children: Secondary | ICD-10-CM | POA: Insufficient documentation

## 2013-03-01 DIAGNOSIS — T7491XA Unspecified adult maltreatment, confirmed, initial encounter: Secondary | ICD-10-CM | POA: Insufficient documentation

## 2013-03-01 DIAGNOSIS — S0990XA Unspecified injury of head, initial encounter: Secondary | ICD-10-CM | POA: Insufficient documentation

## 2013-03-01 MED ORDER — HYDROCODONE-ACETAMINOPHEN 5-325 MG PO TABS: 1.0000 | ORAL_TABLET | Freq: Three times a day (TID) | ORAL | Status: DC | PRN
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
## 2014-03-13 ENCOUNTER — Encounter (HOSPITAL_COMMUNITY): Payer: Self-pay | Admitting: Emergency Medicine

## 2015-04-25 ENCOUNTER — Ambulatory Visit: Payer: Self-pay | Admitting: Internal Medicine

## 2015-05-13 NOTE — L&D Delivery Note (Signed)
Delivery Note Admitted w/ vb, uc's, presumed partial marginal abruption- progressed quickly and developed pressure/urge to push- called by RN to come check. 10/100/+2 BBOW, vtx. Did get 1st dose of BMZ and mag bolus started, did not get 1st dose of PCN for GBS unknown/ptl. NICU called to be in attendance of delivery. Pt began pushing, AROM'd BBOW w/ large amt clear fluid and baby followed right behind it- did not see position. At 10:47 AM a viable female was delivered via Vaginal, Spontaneous Delivery (Presentation: uncertain of position delivered in, but vtx).  APGAR: 5, 7; weight 3 lb 9.5 oz (1630 g).  Infant w/ spontaneous cry/respiration, good tone, vigorous placed skin-to-skin on maternal abdomen, delayed cord clamping x 1min w/ Neo's permission, then cord milked toward baby, clamped x 2 and cut and infant taken to radiant warmer for care by NICU team.  Placenta status: Intact, Spontaneous, w/ retroplacental clot c/w partial abruption.  Cord: 3 vessels with the following complications: None.  Cord pH: pending  Anesthesia: None  Episiotomy: None Lacerations: None Suture Repair: n/a Est. Blood Loss (mL): 50  Mom to postpartum.  Baby to NICU in stable condition. Placenta to path  Marge DuncansBooker, Adell Panek Randall 10/20/2015, 11:22 AM

## 2015-07-02 LAB — OB RESULTS CONSOLE ABO/RH: RH Type: POSITIVE

## 2015-07-02 LAB — OB RESULTS CONSOLE RPR: RPR: NONREACTIVE

## 2015-07-02 LAB — OB RESULTS CONSOLE HGB/HCT, BLOOD
HCT: 33 %
Hemoglobin: 11.2 g/dL

## 2015-07-02 LAB — OB RESULTS CONSOLE RUBELLA ANTIBODY, IGM: Rubella: IMMUNE

## 2015-07-02 LAB — OB RESULTS CONSOLE HIV ANTIBODY (ROUTINE TESTING): HIV: NONREACTIVE

## 2015-07-02 LAB — OB RESULTS CONSOLE VARICELLA ZOSTER ANTIBODY, IGG: VARICELLA IGG: IMMUNE

## 2015-07-02 LAB — OB RESULTS CONSOLE PLATELET COUNT: Platelets: 289 10*3/uL

## 2015-07-02 LAB — OB RESULTS CONSOLE HEPATITIS B SURFACE ANTIGEN: HEP B S AG: NEGATIVE

## 2015-07-02 LAB — OB RESULTS CONSOLE GC/CHLAMYDIA
Chlamydia: NEGATIVE
Gonorrhea: NEGATIVE

## 2015-07-02 LAB — OB RESULTS CONSOLE ANTIBODY SCREEN: Antibody Screen: NEGATIVE

## 2015-07-02 LAB — CYTOLOGY - PAP: PAP SMEAR: NEGATIVE

## 2015-07-18 ENCOUNTER — Other Ambulatory Visit (HOSPITAL_COMMUNITY): Payer: Self-pay | Admitting: Nurse Practitioner

## 2015-07-18 DIAGNOSIS — Z3689 Encounter for other specified antenatal screening: Secondary | ICD-10-CM

## 2015-07-18 DIAGNOSIS — O09522 Supervision of elderly multigravida, second trimester: Secondary | ICD-10-CM

## 2015-07-19 ENCOUNTER — Other Ambulatory Visit (HOSPITAL_COMMUNITY): Payer: Self-pay | Admitting: Nurse Practitioner

## 2015-07-19 ENCOUNTER — Encounter (HOSPITAL_COMMUNITY): Payer: Self-pay

## 2015-07-19 ENCOUNTER — Encounter: Payer: Self-pay | Admitting: *Deleted

## 2015-07-19 ENCOUNTER — Ambulatory Visit (HOSPITAL_COMMUNITY)
Admission: RE | Admit: 2015-07-19 | Discharge: 2015-07-19 | Disposition: A | Discharge status: Home / Self Care | Payer: Medicaid Other | Source: Ambulatory Visit | Attending: Nurse Practitioner | Admitting: Nurse Practitioner

## 2015-07-19 ENCOUNTER — Other Ambulatory Visit (HOSPITAL_COMMUNITY): Payer: Self-pay | Admitting: *Deleted

## 2015-07-19 DIAGNOSIS — Z3A18 18 weeks gestation of pregnancy: Secondary | ICD-10-CM | POA: Insufficient documentation

## 2015-07-19 DIAGNOSIS — O09529 Supervision of elderly multigravida, unspecified trimester: Secondary | ICD-10-CM

## 2015-07-19 DIAGNOSIS — O09522 Supervision of elderly multigravida, second trimester: Secondary | ICD-10-CM | POA: Diagnosis not present

## 2015-07-19 DIAGNOSIS — Z3689 Encounter for other specified antenatal screening: Secondary | ICD-10-CM

## 2015-07-19 DIAGNOSIS — Z36 Encounter for antenatal screening of mother: Secondary | ICD-10-CM | POA: Insufficient documentation

## 2015-07-19 DIAGNOSIS — O09899 Supervision of other high risk pregnancies, unspecified trimester: Secondary | ICD-10-CM

## 2015-07-19 DIAGNOSIS — Z8632 Personal history of gestational diabetes: Secondary | ICD-10-CM

## 2015-07-19 DIAGNOSIS — Z86718 Personal history of other venous thrombosis and embolism: Secondary | ICD-10-CM

## 2015-07-19 DIAGNOSIS — O09292 Supervision of pregnancy with other poor reproductive or obstetric history, second trimester: Secondary | ICD-10-CM | POA: Insufficient documentation

## 2015-07-19 DIAGNOSIS — O0990 Supervision of high risk pregnancy, unspecified, unspecified trimester: Secondary | ICD-10-CM | POA: Insufficient documentation

## 2015-07-23 ENCOUNTER — Encounter: Payer: Self-pay | Admitting: Family

## 2015-07-23 ENCOUNTER — Ambulatory Visit (INDEPENDENT_AMBULATORY_CARE_PROVIDER_SITE_OTHER): Payer: Self-pay | Admitting: Family

## 2015-07-23 ENCOUNTER — Telehealth: Payer: Self-pay | Admitting: *Deleted

## 2015-07-23 VITALS — BP 115/78 | HR 74 | Temp 98.1°F | Wt 142.2 lb

## 2015-07-23 DIAGNOSIS — O0992 Supervision of high risk pregnancy, unspecified, second trimester: Secondary | ICD-10-CM

## 2015-07-23 DIAGNOSIS — Z86718 Personal history of other venous thrombosis and embolism: Secondary | ICD-10-CM | POA: Insufficient documentation

## 2015-07-23 DIAGNOSIS — O09892 Supervision of other high risk pregnancies, second trimester: Secondary | ICD-10-CM

## 2015-07-23 DIAGNOSIS — Z8759 Personal history of other complications of pregnancy, childbirth and the puerperium: Principal | ICD-10-CM

## 2015-07-23 DIAGNOSIS — O26812 Pregnancy related exhaustion and fatigue, second trimester: Secondary | ICD-10-CM

## 2015-07-23 DIAGNOSIS — O09522 Supervision of elderly multigravida, second trimester: Secondary | ICD-10-CM

## 2015-07-23 DIAGNOSIS — Z23 Encounter for immunization: Secondary | ICD-10-CM

## 2015-07-23 LAB — CBC
HEMATOCRIT: 35.7 % — AB (ref 36.0–46.0)
Hemoglobin: 11.9 g/dL — ABNORMAL LOW (ref 12.0–15.0)
MCH: 31.1 pg (ref 26.0–34.0)
MCHC: 33.3 g/dL (ref 30.0–36.0)
MCV: 93.2 fL (ref 78.0–100.0)
MPV: 9.3 fL (ref 8.6–12.4)
PLATELETS: 321 10*3/uL (ref 150–400)
RBC: 3.83 MIL/uL — ABNORMAL LOW (ref 3.87–5.11)
RDW: 14.8 % (ref 11.5–15.5)
WBC: 8 10*3/uL (ref 4.0–10.5)

## 2015-07-23 LAB — POCT URINALYSIS DIP (DEVICE)
BILIRUBIN URINE: NEGATIVE
Glucose, UA: NEGATIVE mg/dL
HGB URINE DIPSTICK: NEGATIVE
Ketones, ur: 15 mg/dL — AB
Leukocytes, UA: NEGATIVE
NITRITE: NEGATIVE
PH: 6.5 (ref 5.0–8.0)
PROTEIN: NEGATIVE mg/dL
Specific Gravity, Urine: 1.01 (ref 1.005–1.030)
Urobilinogen, UA: 0.2 mg/dL (ref 0.0–1.0)

## 2015-07-23 MED ORDER — ENOXAPARIN SODIUM 40 MG/0.4ML ~~LOC~~ SOLN: 40.0000 mg | SUBCUTANEOUS | Status: DC

## 2015-07-23 MED ORDER — ENOXAPARIN SODIUM 30 MG/0.3ML ~~LOC~~ SOLN: 40.0000 mg | SUBCUTANEOUS | Status: DC

## 2015-07-23 MED FILL — ENOXAPARIN 40 MG/0.4 ML SYR: 40 | 21 days supply | Qty: 8 | Fill #0

## 2015-07-23 NOTE — Progress Notes (Signed)
Subjective:    Maria Cook is a [id number] 262w4d being seen today for her first obstetrical visit.  Care transferred from the health department.  Her obstetrical history is significant for advanced maternal age and history of DVT. Patient does intend to breast feed. Pregnancy history fully reviewed.  Patient reports fatigue.  Filed Vitals:   07/23/15 0842  BP: 115/78  Pulse: 74  Temp: 98.1 F (36.7 C)  Weight: 142 lb 3.2 oz (64.501 kg)    HISTORY: OB History  Gravida Para Term Preterm AB SAB TAB Ectopic Multiple Living  7 6 6  0 0 0 0 0 0 6    # Outcome Date GA Lbr Len/2nd Weight Sex Delivery Anes PTL Lv  7 Current           6 Term 05/12/11 8138w5d 04:25 / 00:04 6 lb 0.3 oz (2.73 kg) F Vag-Spont None  Y  5 Term 12/31/06 1730w0d  6 lb 11 oz (3.033 kg) F Vag-Spont   Y     Comments: DVT 05/2006 Tx with Levanox  4 Term 11/30/04 6830w0d  6 lb 12 oz (3.062 kg) F Vag-Spont   Y  3 Term 04/22/03 2130w0d   F Vag-Spont   Y     Comments: home deliviery  2 Term 07/24/99 7730w0d   M Vag-Spont   Y     Comments: home delivery  1 Term 11/17/94 3830w0d   M Vag-Spont   Y     Comments[place]     Past Medical History  Diagnosis Date  . Prior pregnancy complicated by DVT, antepartum 02/24/2011    2008 and 2012 on lovenox  . Gestational diabetes 02/24/2011    in pregnancies 2008 and 2012   History reviewed. No pertinent past surgical history. Family History  Problem Relation Age of Onset  . Depression Father   . Diabetes Neg Hx   . Cancer Neg Hx   . Hypertension Neg Hx      Exam    Uterus:  Fundal Height: 21 cm   Filed Vitals:   07/23/15 0842  BP: 115/78  Pulse: 74  Temp: 98.1 F (36.7 C)  Weight: 142 lb 3.2 oz (64.501 kg)    Fetal Status: Fetal Heart Rate (bpm): 135 Fundal Height: 21 cm Movement: Present     General:  Alert, oriented and cooperative. Patient is in no acute distress.  Skin: Skin is warm and dry. No rash noted.   Cardiovascular: Normal  heart rate noted  Respiratory: Normal respiratory effort, no problems with respiration noted  Abdomen: Soft, gravid, appropriate for gestational age. Pain/Pressure: Present     Pelvic:       Cervical exam deferred        Extremities: Normal range of motion.  Edema: None  Mental Status: Normal mood and affect. Normal behavior. Normal judgment and thought content.   Urinalysis:    Protein negative Glucose negative     Assessment:    Pregnancy: [id number] Patient Active Problem List   Diagnosis Date Noted  . Maternal DVT (deep vein thrombosis), history of 07/23/2015  . Supervision of high-risk pregnancy 07/19/2015  . History of gestational diabetes mellitus (GDM) 07/19/2015  . AMA (advanced maternal age) multigravida 35+ 07/19/2015  . Prior pregnancy complicated by DVT, antepartum 02/24/2011        Plan:     Prenatal records reviewed.  CBC ordered due to fatigue. Prenatal vitamins. RX Lovenox Problem list reviewed and updated. Genetic Screening:  Quad screen normal  Ultrasound discussed; fetal survey: results reviewed.  Follow up in 3 weeks.  Marlis Edelson 07/23/2015

## 2015-07-23 NOTE — Addendum Note (Signed)
Addended by: Jill SideAY, Nekeya Briski L on: 07/23/2015 05:14 PM   Modules accepted: Orders

## 2015-07-23 NOTE — Care Management Note (Signed)
Case Manager notified by [facility] in [facility] of Pt's need for Lovenox.  Case Manager spoke w/ the pt via Clinic Interpreter.  Discussed history of DVT w/ previous pregnancy when she lived in [place].  She was on Lovenox at that time and is aware of how to administer the injection.  Per the Pt she has applied for Medicaid through the Health Dept.  Case Manager spoke w/ Burman Fostereyna - Artistfinancial counselor at [facility] and had the Pt speak with Burman FosterReyna and per Burman Fostereyna, the Pt will only qualify for emergency Medicaid as she does not have a social security number. [facility] [address]  Case Manager wanted to know if the Pt would be able to use one of the outpt [facility] and per the Interpreter, the Pt would be able to use which ever pharmacy she needed to.  All she needs is the address.  Burman FosterReyna explained to the Pt how [facility] and the Pt stated that she has a GPS and would not have a problem finding it.  Case Manager spoke w/ Arlys JohnBrian at [facility] and he can see the Rx and will call the [facility] to let them know that [facility]  Arlys JohnBrian wanted to verify the dosage as the chart has 30mg  and 40mg  listed.  Case Manager had already verified w/ Tresa EndoKelly in the Clinic and per the Provider it is 40mg  q day.  Case Manager spoke w/ the Pt via Clinic Interpreter - the [facility] letter and the list of Pharmacies - including [facility] and their address was given to the Pt and she knows to go to the [facility].  Pt also informed via Interpreter to notify the clinic if she runs out of the medication prior to her next Clinic appt.  Pt voiced understanding and had no questions.  CM available to assist as needed.   [phone]

## 2015-07-23 NOTE — Telephone Encounter (Signed)
Maria Cook[facility] at 10:35 and left a voice message that they received an order that is unclear for lovenox- state the order says 30mg , but instructions say inject 0.4 which would be 40 mg. So need clarification for dose and quantity of doses.    Discussed with Maria Cook and she states order should be 40 mg daily, with quantity 30 and refills 6.  [facility] and he states order has been canceled due to being filled at another pharmacy.  Per chart review Nurse case manager is working with patient. Maria Cholalled Maria Cook , RN Case Manager and she had clarified dose should be 40 mg daily and [facility] get it [facility] for patient.  I entered order in as a phone order since Maria Cook has already had it filled.

## 2015-07-23 NOTE — Patient Instructions (Signed)

## 2015-07-23 NOTE — Progress Notes (Signed)
Flu shot given today

## 2015-08-03 ENCOUNTER — Other Ambulatory Visit (HOSPITAL_COMMUNITY): Payer: Self-pay

## 2015-08-03 NOTE — Telephone Encounter (Signed)
Opened in error

## 2015-08-08 ENCOUNTER — Telehealth: Payer: Self-pay | Admitting: Family Medicine

## 2015-08-08 NOTE — Telephone Encounter (Signed)
Patient called requesting call back from nurse, due to needing refill on medications. Please call back with Spanish interpreter.

## 2015-08-14 MED FILL — ENOXAPARIN 40 MG/0.4 ML SYR: 40 | 21 days supply | Qty: 8 | Fill #1

## 2015-08-14 NOTE — Telephone Encounter (Signed)
No answer or voicemail to leave a message. 

## 2015-08-16 ENCOUNTER — Ambulatory Visit (INDEPENDENT_AMBULATORY_CARE_PROVIDER_SITE_OTHER): Payer: Self-pay | Admitting: Family

## 2015-08-16 ENCOUNTER — Ambulatory Visit (HOSPITAL_COMMUNITY): Payer: No Typology Code available for payment source

## 2015-08-16 VITALS — BP 103/70 | HR 66 | Temp 98.1°F | Wt 145.6 lb

## 2015-08-16 DIAGNOSIS — O0992 Supervision of high risk pregnancy, unspecified, second trimester: Secondary | ICD-10-CM

## 2015-08-16 DIAGNOSIS — Z86718 Personal history of other venous thrombosis and embolism: Secondary | ICD-10-CM

## 2015-08-16 DIAGNOSIS — O09522 Supervision of elderly multigravida, second trimester: Secondary | ICD-10-CM

## 2015-08-16 DIAGNOSIS — Z8759 Personal history of other complications of pregnancy, childbirth and the puerperium: Secondary | ICD-10-CM

## 2015-08-16 LAB — POCT URINALYSIS DIP (DEVICE)
Bilirubin Urine: NEGATIVE
Glucose, UA: NEGATIVE mg/dL
HGB URINE DIPSTICK: NEGATIVE
KETONES UR: NEGATIVE mg/dL
LEUKOCYTES UA: NEGATIVE
Nitrite: NEGATIVE
PROTEIN: NEGATIVE mg/dL
SPECIFIC GRAVITY, URINE: 1.02 (ref 1.005–1.030)
UROBILINOGEN UA: 0.2 mg/dL (ref 0.0–1.0)
pH: 6 (ref 5.0–8.0)

## 2015-08-16 MED ORDER — ENOXAPARIN SODIUM 40 MG/0.4ML ~~LOC~~ SOLN: 40.0000 mg | SUBCUTANEOUS | Status: DC

## 2015-08-16 MED ORDER — PRENATAL VITAMINS PLUS 27-1 MG PO TABS: 1.0000 | ORAL_TABLET | Freq: Once | ORAL | Status: DC

## 2015-08-17 NOTE — Progress Notes (Signed)
Subjective:  Maria Cook is a 40 y.o. [phone] at 2765w1d being seen today for ongoing prenatal care.  She is currently monitored for the following issues for this high-risk pregnancy and has Prior pregnancy complicated by DVT, antepartum; Supervision of high-risk pregnancy; History of gestational diabetes mellitus (GDM); AMA (advanced maternal age) multigravida 35+; and Maternal DVT (deep vein thrombosis), history of on her problem list.  Patient reports no complaints.   .  .  Movement: Present. Denies leaking of fluid.   The following portions of the patient's history were reviewed and updated as appropriate: allergies, current medications, past family history, past medical history, past social history, past surgical history and problem list. Problem list updated.  Objective:   Filed Vitals:   08/16/15 1139  BP: 103/70  Pulse: 66  Temp: 98.1 F (36.7 C)  Weight: 145 lb 9.6 oz (66.044 kg)    Fetal Status: Fetal Heart Rate (bpm): 145 Fundal Height: 24 cm Movement: Present     General:  Alert, oriented and cooperative. Patient is in no acute distress.  Skin: Skin is warm and dry. No rash noted.   Cardiovascular: Normal heart rate noted  Respiratory: Normal respiratory effort, no problems with respiration noted  Abdomen: Soft, gravid, appropriate for gestational age. Pain/Pressure: Present     Pelvic:       Cervical exam deferred        Extremities: Normal range of motion.  Edema: None  Mental Status: Normal mood and affect. Normal behavior. Normal judgment and thought content.   Urinalysis: Urine Protein: Negative Urine Glucose: Negative  Assessment and Plan:  Pregnancy: G7P6006 at 7965w1d  1. Supervision of high-risk pregnancy, second trimester - Continue close observation - Reviewed 3 hr results  2. Maternal DVT (deep vein thrombosis), history of - enoxaparin (LOVENOX) 40 MG/0.4ML injection; Inject 0.4 mLs (40 mg total) into the skin daily.  Dispense: 30 Syringe; Refill:  6  Preterm labor symptoms and general obstetric precautions including but not limited to vaginal bleeding, contractions, leaking of fluid and fetal movement were reviewed in detail with the patient. Please refer to After Visit Summary for other counseling recommendations.  Return in about 3 weeks (around 09/06/2015).   Eino FarberWalidah Kennith GainN Karim, CNM

## 2015-08-21 NOTE — Telephone Encounter (Signed)
Pt was seen in the office on 08/16/2015

## 2015-08-30 ENCOUNTER — Ambulatory Visit (HOSPITAL_COMMUNITY)
Admission: RE | Admit: 2015-08-30 | Discharge: 2015-08-30 | Disposition: A | Discharge status: Home / Self Care | Payer: Medicaid Other | Source: Ambulatory Visit | Attending: Nurse Practitioner | Admitting: Nurse Practitioner

## 2015-08-30 ENCOUNTER — Encounter (HOSPITAL_COMMUNITY): Payer: Self-pay

## 2015-08-30 VITALS — BP 117/71 | HR 81 | Wt 145.1 lb

## 2015-08-30 DIAGNOSIS — O0992 Supervision of high risk pregnancy, unspecified, second trimester: Secondary | ICD-10-CM

## 2015-08-30 DIAGNOSIS — O09522 Supervision of elderly multigravida, second trimester: Secondary | ICD-10-CM | POA: Insufficient documentation

## 2015-08-30 DIAGNOSIS — Z3A24 24 weeks gestation of pregnancy: Secondary | ICD-10-CM | POA: Insufficient documentation

## 2015-08-30 DIAGNOSIS — Z8759 Personal history of other complications of pregnancy, childbirth and the puerperium: Secondary | ICD-10-CM

## 2015-08-30 DIAGNOSIS — Z86718 Personal history of other venous thrombosis and embolism: Secondary | ICD-10-CM

## 2015-08-30 DIAGNOSIS — O09292 Supervision of pregnancy with other poor reproductive or obstetric history, second trimester: Secondary | ICD-10-CM | POA: Insufficient documentation

## 2015-08-30 DIAGNOSIS — Z8632 Personal history of gestational diabetes: Secondary | ICD-10-CM

## 2015-08-30 DIAGNOSIS — O09529 Supervision of elderly multigravida, unspecified trimester: Secondary | ICD-10-CM

## 2015-08-30 NOTE — ED Notes (Signed)
Patient does not speak [place]. Used Eda to interpret for patient.

## 2015-09-03 MED FILL — ENOXAPARIN 40 MG/0.4 ML SYR: 40 | 30 days supply | Qty: 12 | Fill #0

## 2015-09-06 ENCOUNTER — Ambulatory Visit (INDEPENDENT_AMBULATORY_CARE_PROVIDER_SITE_OTHER): Payer: Self-pay | Admitting: Family Medicine

## 2015-09-06 VITALS — BP 112/71 | HR 74 | Wt 146.4 lb

## 2015-09-06 DIAGNOSIS — O09522 Supervision of elderly multigravida, second trimester: Secondary | ICD-10-CM

## 2015-09-06 DIAGNOSIS — O09899 Supervision of other high risk pregnancies, unspecified trimester: Secondary | ICD-10-CM

## 2015-09-06 DIAGNOSIS — O26892 Other specified pregnancy related conditions, second trimester: Secondary | ICD-10-CM

## 2015-09-06 DIAGNOSIS — R0600 Dyspnea, unspecified: Secondary | ICD-10-CM

## 2015-09-06 DIAGNOSIS — Z86718 Personal history of other venous thrombosis and embolism: Secondary | ICD-10-CM

## 2015-09-06 DIAGNOSIS — O0992 Supervision of high risk pregnancy, unspecified, second trimester: Secondary | ICD-10-CM

## 2015-09-06 LAB — POCT URINALYSIS DIP (DEVICE)
Bilirubin Urine: NEGATIVE
GLUCOSE, UA: NEGATIVE mg/dL
HGB URINE DIPSTICK: NEGATIVE
KETONES UR: NEGATIVE mg/dL
Nitrite: NEGATIVE
PH: 7.5 (ref 5.0–8.0)
PROTEIN: 30 mg/dL — AB
SPECIFIC GRAVITY, URINE: 1.02 (ref 1.005–1.030)
Urobilinogen, UA: 0.2 mg/dL (ref 0.0–1.0)

## 2015-09-06 LAB — CBC
HEMATOCRIT: 33.8 % — AB (ref 35.0–45.0)
Hemoglobin: 11 g/dL — ABNORMAL LOW (ref 11.7–15.5)
MCH: 30.4 pg (ref 27.0–33.0)
MCHC: 32.5 g/dL (ref 32.0–36.0)
MCV: 93.4 fL (ref 80.0–100.0)
MPV: 9.6 fL (ref 7.5–12.5)
Platelets: 315 10*3/uL (ref 140–400)
RBC: 3.62 MIL/uL — ABNORMAL LOW (ref 3.80–5.10)
RDW: 14.4 % (ref 11.0–15.0)
WBC: 6.2 10*3/uL (ref 3.8–10.8)

## 2015-09-06 NOTE — Progress Notes (Signed)
Pt reports feeling short of breath. Pt concerned about her low iron level.

## 2015-09-06 NOTE — Progress Notes (Signed)
Subjective:  Maria Cook is a 40 y.o. [phone] at 2237w0d being seen today for ongoing prenatal care.  She is currently monitored for the following issues for this high-risk pregnancy and has Prior pregnancy complicated by DVT, antepartum; Supervision of high-risk pregnancy; History of gestational diabetes mellitus (GDM); AMA (advanced maternal age) multigravida 35+; and Maternal DVT (deep vein thrombosis), history of on her problem list.  Patient reports intermittent SOB and dizziness.  Happens several hours after eating.  Improves with laying down, eating..   Lockie Pares. Vag. Bleeding: None.  Movement: Present. Denies leaking of fluid.   The following portions of the patient's history were reviewed and updated as appropriate: allergies, current medications, past family history, past medical history, past social history, past surgical history and problem list. Problem list updated.  Objective:   Filed Vitals:   09/06/15 1127  BP: 112/71  Pulse: 74  Weight: 146 lb 6.4 oz (66.407 kg)    Fetal Status: Fetal Heart Rate (bpm): 150   Movement: Present     General:  Alert, oriented and cooperative. Patient is in no acute distress.  Skin: Skin is warm and dry. No rash noted.   Cardiovascular: Normal heart rate noted  Respiratory: Normal respiratory effort, no problems with respiration noted  Abdomen: Soft, gravid, appropriate for gestational age. Pain/Pressure: Present     Pelvic: Vag. Bleeding: None     Cervical exam deferred        Extremities: Normal range of motion.  Edema: None  Mental Status: Normal mood and affect. Normal behavior. Normal judgment and thought content.   Urinalysis: Urine Protein: 1+ Urine Glucose: Negative  Assessment and Plan:  Pregnancy: G7P6006 at 5937w0d  1. Shortness of breath due to pregnancy, second trimester Will get CBC to r/o anemia, although by history more likely to be hypoglycemia.  Counseled pt to eat frequently.  No CP - less likely to be PE. - CBC  2.  Supervision of high-risk pregnancy, second trimester FHT, FH normal  3. AMA (advanced maternal age) multigravida 35+, second trimester NST aat 36 weeks.  Has repeat growth 6/1  4. Prior pregnancy complicated by DVT, antepartum, unspecified trimester Continue Lovenox.  Preterm labor symptoms and general obstetric precautions including but not limited to vaginal bleeding, contractions, leaking of fluid and fetal movement were reviewed in detail with the patient. Please refer to After Visit Summary for other counseling recommendations.  No Follow-up on file.   Levie HeritageJacob J Janos Shampine, DO

## 2015-09-08 ENCOUNTER — Encounter (HOSPITAL_COMMUNITY): Payer: Self-pay

## 2015-09-08 ENCOUNTER — Inpatient Hospital Stay (HOSPITAL_COMMUNITY): Payer: Self-pay

## 2015-09-08 ENCOUNTER — Observation Stay (HOSPITAL_COMMUNITY)
Admission: AD | Admit: 2015-09-08 | Discharge: 2015-09-09 | Disposition: A | Discharge status: Home / Self Care | Payer: Self-pay | Source: Ambulatory Visit | Attending: Obstetrics and Gynecology | Admitting: Obstetrics and Gynecology

## 2015-09-08 DIAGNOSIS — K8020 Calculus of gallbladder without cholecystitis without obstruction: Secondary | ICD-10-CM | POA: Diagnosis present

## 2015-09-08 DIAGNOSIS — O09522 Supervision of elderly multigravida, second trimester: Secondary | ICD-10-CM

## 2015-09-08 DIAGNOSIS — R160 Hepatomegaly, not elsewhere classified: Secondary | ICD-10-CM

## 2015-09-08 DIAGNOSIS — Z3A25 25 weeks gestation of pregnancy: Secondary | ICD-10-CM | POA: Insufficient documentation

## 2015-09-08 DIAGNOSIS — Z8759 Personal history of other complications of pregnancy, childbirth and the puerperium: Secondary | ICD-10-CM

## 2015-09-08 DIAGNOSIS — Z86718 Personal history of other venous thrombosis and embolism: Secondary | ICD-10-CM

## 2015-09-08 DIAGNOSIS — R112 Nausea with vomiting, unspecified: Secondary | ICD-10-CM

## 2015-09-08 DIAGNOSIS — R1011 Right upper quadrant pain: Secondary | ICD-10-CM

## 2015-09-08 DIAGNOSIS — O26612 Liver and biliary tract disorders in pregnancy, second trimester: Principal | ICD-10-CM | POA: Insufficient documentation

## 2015-09-08 LAB — CBC
HCT: 30.9 % — ABNORMAL LOW (ref 36.0–46.0)
HEMOGLOBIN: 10.6 g/dL — AB (ref 12.0–15.0)
MCH: 30.7 pg (ref 26.0–34.0)
MCHC: 34.3 g/dL (ref 30.0–36.0)
MCV: 89.6 fL (ref 78.0–100.0)
Platelets: 292 10*3/uL (ref 150–400)
RBC: 3.45 MIL/uL — AB (ref 3.87–5.11)
RDW: 14.1 % (ref 11.5–15.5)
WBC: 6.8 10*3/uL (ref 4.0–10.5)

## 2015-09-08 NOTE — MAU Note (Addendum)
Pain in upper abd that goes to back. Tylenol not helping. Unable to sleep. Denies LOF or bleeding. Vomited 3 times today. No diarrhea.  Last ate gordita about 1400 which is greasy, salad and beans.

## 2015-09-08 NOTE — MAU Provider Note (Signed)
History  Chief Complaint:  Abdominal Pain and Back Pain  Maria Cook is a 40 y.o. [phone] female at 1877w2d presenting w/ report of epigastric pain radiating around both sides to back that began today around 1500 after eating salad, beans, and gordita around 1400. Vomited x 3 since. Tried apap for pain, not helping. Denies diarrhea. Does not feel like uc's.  Reports active fetal movement, contractions: none, vaginal bleeding: none, membranes: intact. Denies uti s/s, abnormal/malodorous vag d/c or vulvovaginal itching/irritation.   [facility]  Next visit 5/11. Pregnancy complicated by AMA, h/o DVT on Lovenox prophylaxis, h/o GDM x 2 w/ elevated early 1hr but normal 3hr  Obstetrical History: OB History    Gravida Para Term Preterm AB TAB SAB Ectopic Multiple Living   7 6 6  0 0 0 0 0 0 6      Past Medical History: Past Medical History  Diagnosis Date  . Prior pregnancy complicated by DVT, antepartum 02/24/2011    2008 and 2012 on lovenox  . Gestational diabetes 02/24/2011    in pregnancies 2008 and 2012    Past Surgical History: History reviewed. No pertinent past surgical history.  Social History: Social History   Social History  . Marital Status: Legally Separated    Spouse Name: N/A  . Number of Children: N/A  . Years of Education: N/A   Social History Main Topics  . Smoking status: Never Smoker   . Smokeless tobacco: Never Used  . Alcohol Use: No  . Drug Use: No  . Sexual Activity: Yes    Birth Control/ Protection: None   Other Topics Concern  . None   Social History Narrative   Moved from [place] in 2013;  Originally from [place].  Lives with father of her two youngest children.  Has 3 children in the [place], three children back [place].  Unemployed.      Allergies: No Known Allergies  Prescriptions prior to admission  Medication Sig Dispense Refill Last Dose  . enoxaparin (LOVENOX) 40 MG/0.4ML injection Inject 0.4 mLs (40 mg  total) into the skin daily. 30 Syringe 6 09/07/2015 at Unknown time  . Prenatal Vit-Fe Fumarate-FA (PRENATAL VITAMIN PO) Take by mouth.   Past Week at Unknown time  . HYDROcodone-acetaminophen (NORCO/VICODIN) 5-325 MG per tablet Take 1 tablet by mouth every 8 (eight) hours as needed for pain. (Patient not taking: Reported on 08/30/2015) 15 tablet 0 Not Taking  . Prenatal Vit-Fe Fumarate-FA (PRENATAL VITAMINS PLUS) 27-1 MG TABS Take 1 tablet by mouth once. 30 tablet 2 Taking  . Promethazine HCl (PHENERGAN PO) Take by mouth. Reported on 08/30/2015   Not Taking    Review of Systems  Pertinent pos/neg as indicated in HPI  Physical Exam  Blood pressure 108/61, pulse 69, temperature 97.5 F (36.4 C), resp. rate 18, last menstrual period 02/23/2015. General appearance: alert, cooperative and no distress Lungs: clear to auscultation bilaterally, normal effort Heart: regular rate and rhythm Abdomen: gravid, soft, non-tender  Spec exam: n/a Cultures/Specimens: n/a    Fetal monitoring: FHR: 135 bpm, variability: min-mod,  Accelerations: Present,  decelerations:  Absent Uterine activity: none  MAU Course  Exam CBC, CMP, Amylase, Lipase RUQ u/s  0100: Spoke w/ Dr. Johna SheriffHoxworth, general surgeon on-call for [facility], notified of u/s findings, labs, request from our attending for consult tonight- states pt stable, no reason to see tonight- will be around in am to see pt. Recommended pain and nausea meds prn and Rocephin 1gm  IV q 24hr Labs:  Results for orders placed or performed during the hospital encounter of 09/08/15 (from the past 24 hour(s))  Urinalysis, Routine w reflex microscopic (not [facility])     Status: Abnormal   Collection Time: 09/08/15 11:00 PM  Result Value Ref Range   Color, Urine YELLOW YELLOW   APPearance CLEAR CLEAR   Specific Gravity, Urine <1.005 (L) 1.005 - 1.030   pH 6.5 5.0 - 8.0   Glucose, UA NEGATIVE NEGATIVE mg/dL   Hgb urine dipstick NEGATIVE NEGATIVE   Bilirubin Urine  NEGATIVE NEGATIVE   Ketones, ur NEGATIVE NEGATIVE mg/dL   Protein, ur NEGATIVE NEGATIVE mg/dL   Nitrite NEGATIVE NEGATIVE   Leukocytes, UA NEGATIVE NEGATIVE  Comprehensive metabolic panel     Status: Abnormal   Collection Time: 09/08/15 11:26 PM  Result Value Ref Range   Sodium 137 135 - 145 mmol/L   Potassium 3.7 3.5 - 5.1 mmol/L   Chloride 110 101 - 111 mmol/L   CO2 21 (L) 22 - 32 mmol/L   Glucose, Bld 86 65 - 99 mg/dL   BUN 8 6 - 20 mg/dL   Creatinine, Ser 1.610.42 (L) 0.44 - 1.00 mg/dL   Calcium 8.7 (L) 8.9 - 10.3 mg/dL   Total Protein 7.3 6.5 - 8.1 g/dL   Albumin 2.9 (L) 3.5 - 5.0 g/dL   AST 20 15 - 41 U/L   ALT 22 14 - 54 U/L   Alkaline Phosphatase 101 38 - 126 U/L   Total Bilirubin 0.4 0.3 - 1.2 mg/dL   GFR calc non Af Amer >60 >60 mL/min   GFR calc Af Amer >60 >60 mL/min   Anion gap 6 5 - 15  Amylase     Status: Abnormal   Collection Time: 09/08/15 11:26 PM  Result Value Ref Range   Amylase 136 (H) 28 - 100 U/L  Lipase, blood     Status: None   Collection Time: 09/08/15 11:26 PM  Result Value Ref Range   Lipase 19 11 - 51 U/L  CBC     Status: Abnormal   Collection Time: 09/08/15 11:26 PM  Result Value Ref Range   WBC 6.8 4.0 - 10.5 K/uL   RBC 3.45 (L) 3.87 - 5.11 MIL/uL   Hemoglobin 10.6 (L) 12.0 - 15.0 g/dL   HCT 09.630.9 (L) 04.536.0 - 40.946.0 %   MCV 89.6 78.0 - 100.0 fL   MCH 30.7 26.0 - 34.0 pg   MCHC 34.3 30.0 - 36.0 g/dL   RDW 81.114.1 91.411.5 - 78.215.5 %   Platelets 292 150 - 400 K/uL    Imaging:   CLINICAL DATA: Right upper quadrant pain and second trimester pregnancy. EXAM: US ABDOMEN LIMITED - RIGHT UPPER QUADRANT COMPARISON: None.  FINDINGS:  Gallbladder: Cholelithiasis with large stone fixed in the neck. Gallbladder is distended and focally tender. No wall thickening or pericholecystic edema.  Common bile duct: Diameter: 4 mm.  Liver: There are 2 homogeneously echogenic masses, left lobe measuring 18 mm and right upper lobe measuring 9 mm. This  appearance is usually from hemangioma. No acute finding.  IMPRESSION: 1. Stone fixed in the gallbladder neck. The gallbladder is focally tender and distended and could be obstructed. No inflammatory features for cholecystitis. 2. Two liver masses measuring up to 18 mm, hyperechoic appearance favoring hemangioma. Sonographic follow-up in 6 months is suggested to confirm stability.   Electronically Signed  By: Marnee SpringJonathon Watts M.D.  On: 09/09/2015 00:13    Assessment and Plan  A:  [redacted]w[redacted]d [facility]  [id number]  Cholelithiasis w/ possible obstruction  Two liver masses favoring hemangioma  Cat 1 FHR  AMA  H/O DVT, currently on Lovenox prophylaxis P:  Per discussion w/ Dr. Odie Sera, admit and they will consult in am  Pain and nausea meds prn  Rocephin 1gm IV q 24hr  Continue Lovenox  [place] daily    Maria Cook CNM,WHNP-BC 4/30/20171:07 AM

## 2015-09-09 DIAGNOSIS — O26612 Liver and biliary tract disorders in pregnancy, second trimester: Secondary | ICD-10-CM

## 2015-09-09 DIAGNOSIS — R160 Hepatomegaly, not elsewhere classified: Secondary | ICD-10-CM

## 2015-09-09 DIAGNOSIS — K8020 Calculus of gallbladder without cholecystitis without obstruction: Secondary | ICD-10-CM | POA: Diagnosis present

## 2015-09-09 DIAGNOSIS — Z3A25 25 weeks gestation of pregnancy: Secondary | ICD-10-CM

## 2015-09-09 LAB — COMPREHENSIVE METABOLIC PANEL
ALK PHOS: 101 U/L (ref 38–126)
ALT: 22 U/L (ref 14–54)
AST: 20 U/L (ref 15–41)
Albumin: 2.9 g/dL — ABNORMAL LOW (ref 3.5–5.0)
Anion gap: 6 (ref 5–15)
BILIRUBIN TOTAL: 0.4 mg/dL (ref 0.3–1.2)
BUN: 8 mg/dL (ref 6–20)
CALCIUM: 8.7 mg/dL — AB (ref 8.9–10.3)
CO2: 21 mmol/L — ABNORMAL LOW (ref 22–32)
CREATININE: 0.42 mg/dL — AB (ref 0.44–1.00)
Chloride: 110 mmol/L (ref 101–111)
Glucose, Bld: 86 mg/dL (ref 65–99)
Potassium: 3.7 mmol/L (ref 3.5–5.1)
Sodium: 137 mmol/L (ref 135–145)
Total Protein: 7.3 g/dL (ref 6.5–8.1)

## 2015-09-09 LAB — AMYLASE: Amylase: 136 U/L — ABNORMAL HIGH (ref 28–100)

## 2015-09-09 LAB — URINALYSIS, ROUTINE W REFLEX MICROSCOPIC
Bilirubin Urine: NEGATIVE
GLUCOSE, UA: NEGATIVE mg/dL
HGB URINE DIPSTICK: NEGATIVE
Ketones, ur: NEGATIVE mg/dL
Leukocytes, UA: NEGATIVE
Nitrite: NEGATIVE
PH: 6.5 (ref 5.0–8.0)
PROTEIN: NEGATIVE mg/dL

## 2015-09-09 LAB — LIPASE, BLOOD: Lipase: 19 U/L (ref 11–51)

## 2015-09-09 MED ORDER — ACETAMINOPHEN 325 MG PO TABS: 650.0000 mg | ORAL_TABLET | ORAL | Status: DC | PRN

## 2015-09-09 MED ORDER — PROMETHAZINE HCL 25 MG PO TABS: 25.0000 mg | ORAL_TABLET | ORAL | Status: DC | PRN

## 2015-09-09 MED ORDER — DOCUSATE SODIUM 100 MG PO CAPS
100.0000 mg | ORAL_CAPSULE | Freq: Every day | ORAL | Status: DC
Start: 2015-09-09 — End: 2015-09-09
  Administered 2015-09-09: 100 mg via ORAL
  Filled 2015-09-09: qty 1

## 2015-09-09 MED ORDER — CALCIUM CARBONATE ANTACID 500 MG PO CHEW: 2.0000 | CHEWABLE_TABLET | ORAL | Status: DC | PRN

## 2015-09-09 MED ORDER — ENOXAPARIN SODIUM 40 MG/0.4ML ~~LOC~~ SOLN
40.0000 mg | SUBCUTANEOUS | Status: DC
  Administered 2015-09-09: 40 mg via SUBCUTANEOUS
  Filled 2015-09-09 (×2): qty 0.4

## 2015-09-09 MED ORDER — ACETAMINOPHEN 325 MG PO TABS
650.0000 mg | ORAL_TABLET | ORAL | Status: DC | PRN
  Administered 2015-09-09: 650 mg via ORAL
  Filled 2015-09-09: qty 2

## 2015-09-09 MED ORDER — ZOLPIDEM TARTRATE 5 MG PO TABS: 5.0000 mg | ORAL_TABLET | Freq: Every evening | ORAL | Status: DC | PRN

## 2015-09-09 MED ORDER — PRENATAL MULTIVITAMIN CH: 1.0000 | ORAL_TABLET | Freq: Every day | ORAL | Status: DC

## 2015-09-09 MED ORDER — DEXTROSE 5 % IV SOLN
1.0000 g | INTRAVENOUS | Status: DC
  Administered 2015-09-09: 1 g via INTRAVENOUS
  Filled 2015-09-09 (×2): qty 10

## 2015-09-09 MED ORDER — DOCUSATE SODIUM 100 MG PO CAPS: 100.0000 mg | ORAL_CAPSULE | Freq: Every day | ORAL | Status: DC

## 2015-09-09 MED ORDER — ZOLPIDEM TARTRATE 5 MG PO TABS
5.0000 mg | ORAL_TABLET | Freq: Every evening | ORAL | Status: DC | PRN
Start: 2015-09-09 — End: 2015-09-09
  Administered 2015-09-09: 5 mg via ORAL
  Filled 2015-09-09: qty 1

## 2015-09-09 MED ORDER — HYDROCODONE-ACETAMINOPHEN 5-325 MG PO TABS: 1.0000 | ORAL_TABLET | ORAL | Status: DC | PRN

## 2015-09-09 MED ORDER — SODIUM CHLORIDE 0.9 % IV SOLN
INTRAVENOUS | Status: DC
  Administered 2015-09-09 (×2): via INTRAVENOUS

## 2015-09-09 MED ORDER — HYDROCODONE-ACETAMINOPHEN 5-325 MG PO TABS
1.0000 | ORAL_TABLET | ORAL | Status: DC | PRN
  Administered 2015-09-09: 1 via ORAL
  Filled 2015-09-09: qty 1

## 2015-09-09 MED ORDER — LACTATED RINGERS IV SOLN
INTRAVENOUS | Status: DC
  Administered 2015-09-09: 02:00:00 via INTRAVENOUS

## 2015-09-09 NOTE — MAU Provider Note (Deleted)
History  Chief Complaint:  Abdominal Pain and Back Pain  Maria Cook is a 40 y.o. [phone] female at 1877w2d presenting w/ report of epigastric pain radiating around both sides to back that began today around 1500 after eating salad, beans, and gordita around 1400. Vomited x 3 since. Tried apap for pain, not helping. Denies diarrhea. Does not feel like uc's.  Reports active fetal movement, contractions: none, vaginal bleeding: none, membranes: intact. Denies uti s/s, abnormal/malodorous vag d/c or vulvovaginal itching/irritation.   [facility]  Next visit 5/11. Pregnancy complicated by AMA, h/o DVT on Lovenox prophylaxis, h/o GDM x 2 w/ elevated early 1hr but normal 3hr  Obstetrical History: OB History    Gravida Para Term Preterm AB TAB SAB Ectopic Multiple Living   7 6 6  0 0 0 0 0 0 6      Past Medical History: Past Medical History  Diagnosis Date  . Prior pregnancy complicated by DVT, antepartum 02/24/2011    2008 and 2012 on lovenox  . Gestational diabetes 02/24/2011    in pregnancies 2008 and 2012    Past Surgical History: History reviewed. No pertinent past surgical history.  Social History: Social History   Social History  . Marital Status: Legally Separated    Spouse Name: N/A  . Number of Children: N/A  . Years of Education: N/A   Social History Main Topics  . Smoking status: Never Smoker   . Smokeless tobacco: Never Used  . Alcohol Use: No  . Drug Use: No  . Sexual Activity: Yes    Birth Control/ Protection: None   Other Topics Concern  . None   Social History Narrative   Moved from [place] in 2013;  Originally from [place].  Lives with father of her two youngest children.  Has 3 children in the [place], three children back [place].  Unemployed.      Allergies: No Known Allergies  Prescriptions prior to admission  Medication Sig Dispense Refill Last Dose  . enoxaparin (LOVENOX) 40 MG/0.4ML injection Inject 0.4 mLs (40 mg  total) into the skin daily. 30 Syringe 6 09/07/2015 at Unknown time  . Prenatal Vit-Fe Fumarate-FA (PRENATAL VITAMIN PO) Take by mouth.   Past Week at Unknown time  . HYDROcodone-acetaminophen (NORCO/VICODIN) 5-325 MG per tablet Take 1 tablet by mouth every 8 (eight) hours as needed for pain. (Patient not taking: Reported on 08/30/2015) 15 tablet 0 Not Taking  . Prenatal Vit-Fe Fumarate-FA (PRENATAL VITAMINS PLUS) 27-1 MG TABS Take 1 tablet by mouth once. 30 tablet 2 Taking  . Promethazine HCl (PHENERGAN PO) Take by mouth. Reported on 08/30/2015   Not Taking    Review of Systems  Pertinent pos/neg as indicated in HPI  Physical Exam  Blood pressure 108/61, pulse 69, temperature 97.5 F (36.4 C), resp. rate 18, last menstrual period 02/23/2015. General appearance: alert, cooperative and no distress Lungs: clear to auscultation bilaterally, normal effort Heart: regular rate and rhythm Abdomen: gravid, soft, non-tender  Spec exam: n/a Cultures/Specimens: n/a    Fetal monitoring: FHR: 135 bpm, variability: min-mod,  Accelerations: Present,  decelerations:  Absent Uterine activity: none  MAU Course  Exam CBC, CMP, Amylase, Lipase RUQ u/s  0100: Spoke w/ Dr. Johna SheriffHoxworth, general surgeon on-call for [facility], notified of u/s findings, labs, request from our attending for consult tonight- states pt stable, no reason to see tonight- will be around in am to see pt. Recommended pain and nausea meds prn and Rocephin 1gm  IV q 24hr Labs:  Results for orders placed or performed during the hospital encounter of 09/08/15 (from the past 24 hour(s))  Urinalysis, Routine w reflex microscopic (not at [facility])     Status: Abnormal   Collection Time: 09/08/15 11:00 PM  Result Value Ref Range   Color, Urine YELLOW YELLOW   APPearance CLEAR CLEAR   Specific Gravity, Urine <1.005 (L) 1.005 - 1.030   pH 6.5 5.0 - 8.0   Glucose, UA NEGATIVE NEGATIVE mg/dL   Hgb urine dipstick NEGATIVE NEGATIVE   Bilirubin Urine  NEGATIVE NEGATIVE   Ketones, ur NEGATIVE NEGATIVE mg/dL   Protein, ur NEGATIVE NEGATIVE mg/dL   Nitrite NEGATIVE NEGATIVE   Leukocytes, UA NEGATIVE NEGATIVE  Comprehensive metabolic panel     Status: Abnormal   Collection Time: 09/08/15 11:26 PM  Result Value Ref Range   Sodium 137 135 - 145 mmol/L   Potassium 3.7 3.5 - 5.1 mmol/L   Chloride 110 101 - 111 mmol/L   CO2 21 (L) 22 - 32 mmol/L   Glucose, Bld 86 65 - 99 mg/dL   BUN 8 6 - 20 mg/dL   Creatinine, Ser 1.610.42 (L) 0.44 - 1.00 mg/dL   Calcium 8.7 (L) 8.9 - 10.3 mg/dL   Total Protein 7.3 6.5 - 8.1 g/dL   Albumin 2.9 (L) 3.5 - 5.0 g/dL   AST 20 15 - 41 U/L   ALT 22 14 - 54 U/L   Alkaline Phosphatase 101 38 - 126 U/L   Total Bilirubin 0.4 0.3 - 1.2 mg/dL   GFR calc non Af Amer >60 >60 mL/min   GFR calc Af Amer >60 >60 mL/min   Anion gap 6 5 - 15  Amylase     Status: Abnormal   Collection Time: 09/08/15 11:26 PM  Result Value Ref Range   Amylase 136 (H) 28 - 100 U/L  Lipase, blood     Status: None   Collection Time: 09/08/15 11:26 PM  Result Value Ref Range   Lipase 19 11 - 51 U/L  CBC     Status: Abnormal   Collection Time: 09/08/15 11:26 PM  Result Value Ref Range   WBC 6.8 4.0 - 10.5 K/uL   RBC 3.45 (L) 3.87 - 5.11 MIL/uL   Hemoglobin 10.6 (L) 12.0 - 15.0 g/dL   HCT 09.630.9 (L) 04.536.0 - 40.946.0 %   MCV 89.6 78.0 - 100.0 fL   MCH 30.7 26.0 - 34.0 pg   MCHC 34.3 30.0 - 36.0 g/dL   RDW 81.114.1 91.411.5 - 78.215.5 %   Platelets 292 150 - 400 K/uL    Imaging:   CLINICAL DATA: Right upper quadrant pain and second trimester pregnancy. EXAM: US ABDOMEN LIMITED - RIGHT UPPER QUADRANT COMPARISON: None.  FINDINGS:  Gallbladder: Cholelithiasis with large stone fixed in the neck. Gallbladder is distended and focally tender. No wall thickening or pericholecystic edema.  Common bile duct: Diameter: 4 mm.  Liver: There are 2 homogeneously echogenic masses, left lobe measuring 18 mm and right upper lobe measuring 9 mm. This  appearance is usually from hemangioma. No acute finding.  IMPRESSION: 1. Stone fixed in the gallbladder neck. The gallbladder is focally tender and distended and could be obstructed. No inflammatory features for cholecystitis. 2. Two liver masses measuring up to 18 mm, hyperechoic appearance favoring hemangioma. Sonographic follow-up in 6 months is suggested to confirm stability.   Electronically Signed  By: Marnee SpringJonathon Watts M.D.  On: 09/09/2015 00:13    Assessment and Plan  A:  [redacted]w[redacted]d [facility]  [id number]  Cholelithiasis w/ possible obstruction  Two liver masses favoring hemangioma  Cat 1 FHR  AMA  H/O DVT, currently on Lovenox prophylaxis P:  Per discussion w/ Dr. Odie Sera, admit and they will consult in am  Pain and nausea meds prn  Rocephin 1gm IV q 24hr  Continue Lovenox  [place] daily    Marge Duncans CNM,WHNP-BC 4/30/20171:41 AM

## 2015-09-09 NOTE — Discharge Instructions (Signed)
Dieta con bajo contenido de grasas para la pancreatitis o los trastornos de la vesícula °(Low-Fat Diet for Pancreatitis or Gallbladder Conditions) °Una dieta con bajo contenido de grasas puede ser útil si usted tiene pancreatitis o trastornos de la vesícula. En estos trastornos, el páncreas y la vesícula tienen dificultades para digerir las grasas. Un plan de alimentación saludable con menos grasas ayudará a que el páncreas y la vesícula descansen, y reducirá los síntomas. °¿QUÉ DEBO SABER ACERCA DE ESTA DIETA? °· Consuma una dieta con bajo contenido de grasas. °¨ Reduzca el consumo de grasas a menos del 20 % al 30 % del total de calorías diarias. Esto representa menos de 50 a 60 gramos de grasas por día. °¨ Recuerde que la dieta debe incluir algo de grasa. Consulte al nutricionista cuál debe ser su meta diaria. °¨ Elija alimentos saludables sin grasas o con bajo contenido de grasas. Busque las palabras "sin grasa", "bajo en grasas" o "bajo contenido de grasas". °¨ Como guía, mire la etiqueta y elija alimentos con menos de 3 gramos de grasa por porción. Coma solo una porción. °· Evite el alcohol. °· No fumar. Si necesita ayuda para dejar de fumar, hable con el médico. °· Haga comidas pequeñas y frecuentes en lugar de 3 comidas abundantes y pesadas. °¿QUÉ ALIMENTOS PUEDO COMER? °Cereales °Incluya granos y almidones saludables, como papas, pan integral, cereales ricos en fibras y arroz integral. Elija opciones de cereales integrales siempre que sea posible. En los adultos, los cereales integrales deben representar del 45 % al 65 % de las calorías diarias.  °Frutas y verduras °Coma muchas frutas y verduras. Las frutas y verduras frescas aportan fibra a la dieta. °Carnes y otras fuentes de proteínas °Coma carnes magras, como pollo y cerdo. Quite la grasa de la carne antes de cocinarla. Los huevos, el pescado y los frijoles son otras fuentes de proteínas. En los adultos, estos alimentos deben representar entre el 10 % y  el 35 % de las calorías diarias. °Lácteos °Elija leche y otros productos lácteos con bajo contenido de grasas. Los lácteos aportan grasas y proteínas, además de calcio.  °Grasas y aceites °Limite los alimentos con alto contenido de grasas, como las comidas fritas, los dulces, los productos horneados y las bebidas azucaradas.  °Otros °Los condimentos y las salsas cremosas, como la mayonesa, pueden aportar grasa adicional. Piense si necesita usarlos o no, o use pequeñas cantidades u opciones con bajo contenido de grasas. °¿QUÉ ALIMENTOS NO ESTÁN RECOMENDADOS? °· Los alimentos con alto contenido de grasas, por ejemplo: °¨ Alimentos horneados. °¨ Helados. °¨ Tostadas francesas. °¨ Panecillos dulces. °¨ Pizza. °¨ Pan de queso. °¨ Alimentos rebozados o cubiertos con mantequilla, salsas cremosas o queso. °¨ Comidas fritas. °¨ Postres y bebidas azucaradas. °· Alimentos que causan gases o meteorismo °  °Esta información no tiene como fin reemplazar el consejo del médico. Asegúrese de hacerle al médico cualquier pregunta que tenga. °  °Document Released: 05/03/2013 °Elsevier Interactive Patient Education ©2016 [employer] Inc[employer] ° ° °

## 2015-09-09 NOTE — H&P (Signed)
Cheral MarkerKimberly R Gaelen Brager, CNM Certified Nurse Midwife Cosign Needed Obstetrics MAU Provider Note 09/08/2015 11:24 PM    Expand All Collapse All    History  Chief Complaint: Abdominal Pain and Back Pain  Aurther LoftMaria Morales Cook is a 40 y.o. [phone] female at 297w2d presenting w/ report of epigastric pain radiating around both sides to back that began today around 1500 after eating salad, beans, and gordita around 1400. Vomited x 3 since. Tried apap for pain, not helping. Denies diarrhea. Does not feel like uc's.  Reports active fetal movement, contractions: none, vaginal bleeding: none, membranes: intact. Denies uti s/s, abnormal/malodorous vag d/c or vulvovaginal itching/irritation.  [facility] Next visit 5/11. Pregnancy complicated by AMA, h/o DVT on Lovenox prophylaxis, h/o GDM x 2 w/ elevated early 1hr but normal 3hr  Obstetrical History: OB History    Gravida Para Term Preterm AB TAB SAB Ectopic Multiple Living   7 6 6  0 0 0 0 0 0 6      Past Medical History: Past Medical History  Diagnosis Date  . Prior pregnancy complicated by DVT, antepartum 02/24/2011    2008 and 2012 on lovenox  . Gestational diabetes 02/24/2011    in pregnancies 2008 and 2012    Past Surgical History: History reviewed. No pertinent past surgical history.  Social History: Social History   Social History  . Marital Status: Legally Separated    Spouse Name: N/A  . Number of Children: N/A  . Years of Education: N/A   Social History Main Topics  . Smoking status: Never Smoker   . Smokeless tobacco: Never Used  . Alcohol Use: No  . Drug Use: No  . Sexual Activity: Yes    Birth Control/ Protection: None   Other Topics Concern  . None   Social History Narrative   Moved from [place] in 2013; Originally from [place]. Lives with father of her two youngest children. Has 3 children in the [place], three  children back in [place]. Unemployed.     Allergies: No Known Allergies  Prescriptions prior to admission  Medication Sig Dispense Refill Last Dose  . enoxaparin (LOVENOX) 40 MG/0.4ML injection Inject 0.4 mLs (40 mg total) into the skin daily. 30 Syringe 6 09/07/2015 at Unknown time  . Prenatal Vit-Fe Fumarate-FA (PRENATAL VITAMIN PO) Take by mouth.   Past Week at Unknown time  . HYDROcodone-acetaminophen (NORCO/VICODIN) 5-325 MG per tablet Take 1 tablet by mouth every 8 (eight) hours as needed for pain. (Patient not taking: Reported on 08/30/2015) 15 tablet 0 Not Taking  . Prenatal Vit-Fe Fumarate-FA (PRENATAL VITAMINS PLUS) 27-1 MG TABS Take 1 tablet by mouth once. 30 tablet 2 Taking  . Promethazine HCl (PHENERGAN PO) Take by mouth. Reported on 08/30/2015   Not Taking    Review of Systems  Pertinent pos/neg as indicated in HPI  Physical Exam  Blood pressure 108/61, pulse 69, temperature 97.5 F (36.4 C), resp. rate 18, last menstrual period 02/23/2015. General appearance: alert, cooperative and no distress Lungs: clear to auscultation bilaterally, normal effort Heart: regular rate and rhythm Abdomen: gravid, soft, non-tender  Spec exam: n/a Cultures/Specimens: n/a    Fetal monitoring: FHR: 135 bpm, variability: min-mod, Accelerations: Present, decelerations: Absent Uterine activity: none  MAU Course  Exam CBC, CMP, Amylase, Lipase RUQ u/s  0100: Spoke w/ Dr. Johna SheriffHoxworth, general surgeon on-call for [facility], notified of u/s findings, labs, request from our attending for consult tonight- states pt stable, no reason to see tonight- will be  around in am to see pt. Recommended pain and nausea meds prn and Rocephin 1gm IV q 24hr Labs:   Lab Results Last 24 Hours    Results for orders placed or performed during the hospital encounter of 09/08/15 (from the past 24 hour(s))  Urinalysis, Routine w reflex microscopic (not at [facility])  Status: Abnormal   Collection Time: 09/08/15 11:00 PM  Result Value Ref Range   Color, Urine YELLOW YELLOW   APPearance CLEAR CLEAR   Specific Gravity, Urine <1.005 (L) 1.005 - 1.030   pH 6.5 5.0 - 8.0   Glucose, UA NEGATIVE NEGATIVE mg/dL   Hgb urine dipstick NEGATIVE NEGATIVE   Bilirubin Urine NEGATIVE NEGATIVE   Ketones, ur NEGATIVE NEGATIVE mg/dL   Protein, ur NEGATIVE NEGATIVE mg/dL   Nitrite NEGATIVE NEGATIVE   Leukocytes, UA NEGATIVE NEGATIVE  Comprehensive metabolic panel Status: Abnormal   Collection Time: 09/08/15 11:26 PM  Result Value Ref Range   Sodium 137 135 - 145 mmol/L   Potassium 3.7 3.5 - 5.1 mmol/L   Chloride 110 101 - 111 mmol/L   CO2 21 (L) 22 - 32 mmol/L   Glucose, Bld 86 65 - 99 mg/dL   BUN 8 6 - 20 mg/dL   Creatinine, Ser 4.09 (L) 0.44 - 1.00 mg/dL   Calcium 8.7 (L) 8.9 - 10.3 mg/dL   Total Protein 7.3 6.5 - 8.1 g/dL   Albumin 2.9 (L) 3.5 - 5.0 g/dL   AST 20 15 - 41 U/L   ALT 22 14 - 54 U/L   Alkaline Phosphatase 101 38 - 126 U/L   Total Bilirubin 0.4 0.3 - 1.2 mg/dL   GFR calc non Af Amer >60 >60 mL/min   GFR calc Af Amer >60 >60 mL/min   Anion gap 6 5 - 15  Amylase Status: Abnormal   Collection Time: 09/08/15 11:26 PM  Result Value Ref Range   Amylase 136 (H) 28 - 100 U/L  Lipase, blood Status: None   Collection Time: 09/08/15 11:26 PM  Result Value Ref Range   Lipase 19 11 - 51 U/L  CBC Status: Abnormal   Collection Time: 09/08/15 11:26 PM  Result Value Ref Range   WBC 6.8 4.0 - 10.5 K/uL   RBC 3.45 (L) 3.87 - 5.11 MIL/uL   Hemoglobin 10.6 (L) 12.0 - 15.0 g/dL   HCT 81.1 (L) 91.4 - 78.2 %   MCV 89.6 78.0 - 100.0 fL   MCH 30.7 26.0 - 34.0 pg   MCHC 34.3 30.0 - 36.0 g/dL   RDW 95.6 21.3 - 08.6 %   Platelets 292 150 - 400 K/uL       Imaging:   CLINICAL DATA: Right upper quadrant pain and second trimester pregnancy. EXAM: US ABDOMEN LIMITED - RIGHT UPPER QUADRANT COMPARISON: None.  FINDINGS:  Gallbladder: Cholelithiasis with large stone fixed in the neck. Gallbladder is distended and focally tender. No wall thickening or pericholecystic edema.  Common bile duct: Diameter: 4 mm.  Liver: There are 2 homogeneously echogenic masses, left lobe measuring 18 mm and right upper lobe measuring 9 mm. This appearance is usually from hemangioma. No acute finding.  IMPRESSION: 1. Stone fixed in the gallbladder neck. The gallbladder is focally tender and distended and could be obstructed. No inflammatory features for cholecystitis. 2. Two liver masses measuring up to 18 mm, hyperechoic appearance favoring hemangioma. Sonographic follow-up in 6 months is suggested to confirm stability.   Electronically Signed  By: Marnee Spring M.D.  On: 09/09/2015 00:13  Assessment and Plan  A: [redacted]w[redacted]d [facility] [id number] Cholelithiasis w/ possible obstruction Two liver masses favoring hemangioma Cat 1 FHR AMA H/O DVT, currently on Lovenox prophylaxis P: Per discussion w/ Dr. Odie Sera, admit and they will consult in am Pain and nausea meds prn Rocephin 1gm IV q 24hr Continue Lovenox  [place] daily  Marge Duncans CNM,WHNP-BC 4/30/20171:07 AM

## 2015-09-09 NOTE — Progress Notes (Signed)
Assisted RN with interpretation of patient discharge instructions.  °Spanish Interpreter  °

## 2015-09-09 NOTE — Discharge Summary (Signed)
Physician Discharge Summary  Patient ID: Maria Cook MRN: [id number] DOB/AGE: [birth year] 40 y.o.  Admit date: 09/08/2015 Discharge date: 09/09/2015  Admission Diagnoses: abdominal , back pain [redacted] wk pregnant  Discharge Diagnoses:  Active Problems:   Cholelithiasis affecting pregnancy in second trimester, antepartum   Liver masses suspected hemangioma of liver x 2   Discharged Condition: good  Hospital Course: Chief Complaint: Abdominal Pain and Back Pain  Maria Cook is a 40 y.o. [phone] female at 277w2d presenting w/ report of epigastric pain radiating around both sides to back that began today around 1500 after eating salad, beans, and gordita around 1400. Vomited x 3 since. Tried apap for pain, not helping. Denies diarrhea. Does not feel like uc's.  Reports active fetal movement, contractions: none, vaginal bleeding: none, membranes: intact. Denies uti s/s, abnormal/malodorous vag d/c or vulvovaginal itching/irritation.  [facility] Next visit 5/11. Pregnancy complicated by AMA, h/o DVT on Lovenox prophylaxis, h/o GDM x 2 w/ elevated early 1hr but normal 3hr   Consults: general surgery Assessment/Plan: The patient has a gallstone that has may be intermittently symptomatic. At this point given her high risk pregnancy I would recommend treating her symptoms to get her to the end of her pregnancy. Once the baby is delivered we can consider removing gallbladder.  TOTH III,PAUL S 09/09/2015, 10:00 AM   Significant Diagnostic Studies: radiology: Ultrasound: CLINICAL DATA: Right upper quadrant pain and second trimester CLINICAL DATA: Right upper quadrant pain and second trimester pregnancy. EXAM: US ABDOMEN LIMITED - RIGHT UPPER QUADRANT COMPARISON: None.  FINDINGS:  Gallbladder: Cholelithiasis with large stone fixed in the neck. Gallbladder is distended and focally tender. No wall thickening or pericholecystic edema.  Common bile  duct: Diameter: 4 mm.  Liver: There are 2 homogeneously echogenic masses, left lobe measuring 18 mm and right upper lobe measuring 9 mm. This appearance is usually from hemangioma. No acute finding.  IMPRESSION: 1. Stone fixed in the gallbladder neck. The gallbladder is focally tender and distended and could be obstructed. No inflammatory features for cholecystitis. 2. Two liver masses measuring up to 18 mm, hyperechoic appearance favoring hemangioma. Sonographic follow-up in 6 months is suggested to confirm stability.   Electronically Signed  By: Marnee SpringJonathon Watts M.D.  Treatments: IV hydration and antibiotics: ceftriaxone  Discharge Exam: Blood pressure 84/49, pulse 76, temperature 98.1 F (36.7 C), temperature source Oral, resp. rate 16, height 5\' 2"  (1.575 m), weight 63.05 kg (139 lb), last menstrual period 02/23/2015, SpO2 99 %. General appearance: alert and no distress Head: Normocephalic, without obvious abnormality, atraumatic GI: soft, non-tender; bowel sounds normal; no masses,  no organomegaly  Disposition: 01-Home or Self Care  Discharge Instructions    Call MD for:  persistant nausea and vomiting    Complete by:  As directed      Call MD for:  severe uncontrolled pain    Complete by:  As directed      Call MD for:  temperature >100.4    Complete by:  As directed      Diet - low sodium heart healthy    Complete by:  As directed      Increase activity slowly    Complete by:  As directed             Medication List    TAKE these medications        enoxaparin 40 MG/0.4ML injection  Commonly known as:  LOVENOX  Inject 0.4 mLs (40 mg total) into the  skin daily.     HYDROcodone-acetaminophen 5-325 MG tablet  Commonly known as:  NORCO/VICODIN  Take 1-2 tablets by mouth every 4 (four) hours as needed for moderate pain or severe pain.     PRENATAL VITAMINS PLUS 27-1 MG Tabs  Take 1 tablet by mouth once.     promethazine 25 MG tablet  Commonly known  as:  PHENERGAN  Take 1 tablet (25 mg total) by mouth every 4 (four) hours as needed for nausea or vomiting.           Follow-up Information    Follow up with THE [facility].   Why:  high risk appt   Contact information:   [address] [id number] mc [place] [address] [phone]      Follow up In 3 weeks.   Why:  28 wk labs and visit      Signed: Tilda Burrow 09/09/2015, 2:39 PM

## 2015-09-09 NOTE — Consult Note (Signed)
Reason for Consult:abdominal pain Referring Physician: Dr. Jodi Geralds Maria Cook is an 40 y.o. female.  HPI: The patient is a 40 yo hf who presents with abdominal pain. She is [redacted] weeks pregnant with a high risk pregnancy. She underwent u/s which showed a stone in gallbladder. Wbc normal. lft's normal  Past Medical History  Diagnosis Date  . Prior pregnancy complicated by DVT, antepartum 02/24/2011    2008 and 2012 on lovenox  . Gestational diabetes 02/24/2011    in pregnancies 2008 and 2012    History reviewed. No pertinent past surgical history.  Family History  Problem Relation Age of Onset  . Depression Father   . Diabetes Neg Hx   . Cancer Neg Hx   . Hypertension Neg Hx     Social History:  reports that she has never smoked. She has never used smokeless tobacco. She reports that she does not drink alcohol or use illicit drugs.  Allergies: No Known Allergies  Medications: I have reviewed the patient's current medications.  Results for orders placed or performed during the hospital encounter of 09/08/15 (from the past 48 hour(s))  Urinalysis, Routine w reflex microscopic (not at [facility])     Status: Abnormal   Collection Time: 09/08/15 11:00 PM  Result Value Ref Range   Color, Urine YELLOW YELLOW   APPearance CLEAR CLEAR   Specific Gravity, Urine <1.005 (L) 1.005 - 1.030   pH 6.5 5.0 - 8.0   Glucose, UA NEGATIVE NEGATIVE mg/dL   Hgb urine dipstick NEGATIVE NEGATIVE   Bilirubin Urine NEGATIVE NEGATIVE   Ketones, ur NEGATIVE NEGATIVE mg/dL   Protein, ur NEGATIVE NEGATIVE mg/dL   Nitrite NEGATIVE NEGATIVE   Leukocytes, UA NEGATIVE NEGATIVE    Comment: MICROSCOPIC NOT DONE ON URINES WITH NEGATIVE PROTEIN, BLOOD, LEUKOCYTES, NITRITE, OR GLUCOSE <1000 mg/dL.  Comprehensive metabolic panel     Status: Abnormal   Collection Time: 09/08/15 11:26 PM  Result Value Ref Range   Sodium 137 135 - 145 mmol/L   Potassium 3.7 3.5 - 5.1 mmol/L   Chloride 110 101 - 111 mmol/L    CO2 21 (L) 22 - 32 mmol/L   Glucose, Bld 86 65 - 99 mg/dL   BUN 8 6 - 20 mg/dL   Creatinine, Ser 0.42 (L) 0.44 - 1.00 mg/dL   Calcium 8.7 (L) 8.9 - 10.3 mg/dL   Total Protein 7.3 6.5 - 8.1 g/dL   Albumin 2.9 (L) 3.5 - 5.0 g/dL   AST 20 15 - 41 U/L   ALT 22 14 - 54 U/L   Alkaline Phosphatase 101 38 - 126 U/L   Total Bilirubin 0.4 0.3 - 1.2 mg/dL   GFR calc non Af Amer >60 >60 mL/min   GFR calc Af Amer >60 >60 mL/min    Comment: (NOTE) The eGFR has been calculated using the CKD EPI equation. This calculation has not been validated in all clinical situations. eGFR's persistently <60 mL/min signify possible Chronic Kidney Disease.    Anion gap 6 5 - 15  Amylase     Status: Abnormal   Collection Time: 09/08/15 11:26 PM  Result Value Ref Range   Amylase 136 (H) 28 - 100 U/L  Lipase, blood     Status: None   Collection Time: 09/08/15 11:26 PM  Result Value Ref Range   Lipase 19 11 - 51 U/L  CBC     Status: Abnormal   Collection Time: 09/08/15 11:26 PM  Result Value Ref Range  WBC 6.8 4.0 - 10.5 K/uL   RBC 3.45 (L) 3.87 - 5.11 MIL/uL   Hemoglobin 10.6 (L) 12.0 - 15.0 g/dL   HCT 30.9 (L) 36.0 - 46.0 %   MCV 89.6 78.0 - 100.0 fL   MCH 30.7 26.0 - 34.0 pg   MCHC 34.3 30.0 - 36.0 g/dL   RDW 14.1 11.5 - 15.5 %   Platelets 292 150 - 400 K/uL    US Abdomen Limited Ruq  09/09/2015  CLINICAL DATA:  Right upper quadrant pain and second trimester pregnancy. EXAM: US ABDOMEN LIMITED - RIGHT UPPER QUADRANT COMPARISON:  None. FINDINGS: Gallbladder: Cholelithiasis with large stone fixed in the neck. Gallbladder is distended and focally tender. No wall thickening or pericholecystic edema. Common bile duct: Diameter: 4 mm. Liver: There are 2 homogeneously echogenic masses, left lobe measuring 18 mm and right upper lobe measuring 9 mm. This appearance is usually from hemangioma. No acute finding. IMPRESSION: 1. Stone fixed in the gallbladder neck. The gallbladder is focally tender and distended  and could be obstructed. No inflammatory features for cholecystitis. 2. Two liver masses measuring up to 18 mm, hyperechoic appearance favoring hemangioma. Sonographic follow-up in 6 months is suggested to confirm stability. Electronically Signed   By: Monte Fantasia M.D.   On: 09/09/2015 00:13    Review of Systems  Constitutional: Negative.   HENT: Negative.   Eyes: Negative.   Respiratory: Negative.   Cardiovascular: Negative.   Gastrointestinal: Positive for nausea, vomiting and abdominal pain.  Genitourinary: Negative.   Musculoskeletal: Negative.   Skin: Negative.   Neurological: Negative.   Endo/Heme/Allergies: Negative.   Psychiatric/Behavioral: Negative.    Blood pressure 81/53, pulse 82, temperature 97.7 F (36.5 C), temperature source Oral, resp. rate 18, height 5' 2"  (1.575 m), weight 63.05 kg (139 lb), last menstrual period 02/23/2015, SpO2 99 %. Physical Exam  Constitutional: She is oriented to person, place, and time. She appears well-developed and well-nourished.  HENT:  Head: Normocephalic and atraumatic.  Eyes: Conjunctivae and EOM are normal. Pupils are equal, round, and reactive to light.  Neck: Normal range of motion. Neck supple.  Cardiovascular: Normal rate, regular rhythm and normal heart sounds.   Respiratory: Effort normal and breath sounds normal.  GI: Soft. Bowel sounds are normal. There is no tenderness.  Musculoskeletal: Normal range of motion.  Neurological: She is alert and oriented to person, place, and time.  Skin: Skin is warm and dry.  Psychiatric: She has a normal mood and affect. Her behavior is normal.    Assessment/Plan: The patient has a gallstone that has may be intermittently symptomatic. At this point given her high risk pregnancy I would recommend treating her symptoms to get her to the end of her pregnancy. Once the baby is delivered we can consider removing gallbladder.  TOTH III,Leather Estis S 09/09/2015, 10:00 AM

## 2015-09-20 ENCOUNTER — Ambulatory Visit (INDEPENDENT_AMBULATORY_CARE_PROVIDER_SITE_OTHER): Payer: Medicaid Other | Admitting: Family

## 2015-09-20 VITALS — BP 92/65 | HR 67 | Wt 144.1 lb

## 2015-09-20 DIAGNOSIS — O09522 Supervision of elderly multigravida, second trimester: Secondary | ICD-10-CM

## 2015-09-20 DIAGNOSIS — Z86718 Personal history of other venous thrombosis and embolism: Secondary | ICD-10-CM

## 2015-09-20 DIAGNOSIS — O09893 Supervision of other high risk pregnancies, third trimester: Secondary | ICD-10-CM

## 2015-09-20 DIAGNOSIS — Z23 Encounter for immunization: Secondary | ICD-10-CM

## 2015-09-20 DIAGNOSIS — O99012 Anemia complicating pregnancy, second trimester: Secondary | ICD-10-CM

## 2015-09-20 DIAGNOSIS — R160 Hepatomegaly, not elsewhere classified: Secondary | ICD-10-CM

## 2015-09-20 DIAGNOSIS — O0992 Supervision of high risk pregnancy, unspecified, second trimester: Secondary | ICD-10-CM

## 2015-09-20 DIAGNOSIS — Z8632 Personal history of gestational diabetes: Secondary | ICD-10-CM

## 2015-09-20 DIAGNOSIS — D649 Anemia, unspecified: Secondary | ICD-10-CM

## 2015-09-20 LAB — POCT URINALYSIS DIP (DEVICE)
Bilirubin Urine: NEGATIVE
Glucose, UA: NEGATIVE mg/dL
HGB URINE DIPSTICK: NEGATIVE
Ketones, ur: NEGATIVE mg/dL
LEUKOCYTES UA: NEGATIVE
NITRITE: NEGATIVE
PH: 7 (ref 5.0–8.0)
PROTEIN: NEGATIVE mg/dL
SPECIFIC GRAVITY, URINE: 1.02 (ref 1.005–1.030)
Urobilinogen, UA: 0.2 mg/dL (ref 0.0–1.0)

## 2015-09-20 MED ORDER — FERROUS SULFATE 325 (65 FE) MG PO TABS: 325.0000 mg | ORAL_TABLET | Freq: Two times a day (BID) | ORAL | Status: DC

## 2015-09-20 NOTE — Progress Notes (Signed)
Tdap given today.

## 2015-09-20 NOTE — Patient Instructions (Signed)
Prune Juice

## 2015-09-20 NOTE — Progress Notes (Signed)
Subjective:  Maria Cook is a 40 y.o. [phone] at 6344w0d being seen today for ongoing prenatal care.  She is currently monitored for the following issues for this high-risk pregnancy and has Prior pregnancy complicated by DVT, antepartum; Supervision of high-risk pregnancy; History of gestational diabetes mellitus (GDM); AMA (advanced maternal age) multigravida 35+; Maternal DVT (deep vein thrombosis), history of; Cholelithiasis affecting pregnancy in second trimester, antepartum; and Liver masses on her problem list.  Patient reports no complaints.   .  .  Movement: Present. Denies leaking of fluid.   The following portions of the patient's history were reviewed and updated as appropriate: allergies, current medications, past family history, past medical history, past social history, past surgical history and problem list. Problem list updated.  Objective:   Filed Vitals:   09/20/15 1027  BP: 92/65  Pulse: 67  Weight: 144 lb 1.6 oz (65.363 kg)    Fetal Status:   Fundal Height: 28 cm Movement: Present     General:  Alert, oriented and cooperative. Patient is in no acute distress.  Skin: Skin is warm and dry. No rash noted.   Cardiovascular: Normal heart rate noted  Respiratory: Normal respiratory effort, no problems with respiration noted  Abdomen: Soft, gravid, appropriate for gestational age. Pain/Pressure: Present     Pelvic:       Cervical exam deferred        Extremities: Normal range of motion.  Edema: None  Mental Status: Normal mood and affect. Normal behavior. Normal judgment and thought content.   Urinalysis: Urine Protein: Negative Urine Glucose: Negative  Assessment and Plan:  Pregnancy: [id number] at 3044w0d  1. Supervision of high-risk pregnancy, second trimester - Close monitoring - Need one hour at next visit  2. Maternal anemia in pregnancy, antepartum, second trimester - ferrous sulfate (FERROUSUL) 325 (65 FE) MG tablet; Take 1 tablet (325 mg total) by mouth  2 (two) times daily.  Dispense: 60 tablet; Refill: 1  3. Prior pregnancy complicated by DVT, antepartum, third trimester - Continue Lovenox  4. Liver masses - Follow-up in Sep 2017  5. History of gestational diabetes mellitus (GDM) - Obtain 1 hr next visit (not given today)  Preterm labor symptoms and general obstetric precautions including but not limited to vaginal bleeding, contractions, leaking of fluid and fetal movement were reviewed in detail with the patient. Please refer to After Visit Summary for other counseling recommendations.  Return in about 2 weeks (around 10/04/2015).   Eino FarberWalidah Kennith GainN Karim, CNM

## 2015-10-04 ENCOUNTER — Encounter: Payer: Medicaid Other | Admitting: Obstetrics and Gynecology

## 2015-10-10 MED FILL — ENOXAPARIN 40 MG/0.4 ML SYR: 40 | 30 days supply | Qty: 12 | Fill #1

## 2015-10-11 ENCOUNTER — Other Ambulatory Visit (HOSPITAL_COMMUNITY): Payer: Self-pay | Admitting: Obstetrics and Gynecology

## 2015-10-11 ENCOUNTER — Encounter (HOSPITAL_COMMUNITY): Payer: Self-pay

## 2015-10-11 ENCOUNTER — Ambulatory Visit (HOSPITAL_COMMUNITY)
Admission: RE | Admit: 2015-10-11 | Discharge: 2015-10-11 | Disposition: A | Discharge status: Home / Self Care | Payer: Medicaid Other | Source: Ambulatory Visit | Attending: Family Medicine | Admitting: Family Medicine

## 2015-10-11 DIAGNOSIS — O09523 Supervision of elderly multigravida, third trimester: Secondary | ICD-10-CM | POA: Insufficient documentation

## 2015-10-11 DIAGNOSIS — O09529 Supervision of elderly multigravida, unspecified trimester: Secondary | ICD-10-CM

## 2015-10-11 DIAGNOSIS — Z3A30 30 weeks gestation of pregnancy: Secondary | ICD-10-CM

## 2015-10-19 ENCOUNTER — Inpatient Hospital Stay (HOSPITAL_COMMUNITY)
Admission: AD | Admit: 2015-10-19 | Discharge: 2015-10-19 | Disposition: A | Discharge status: Home / Self Care | Payer: Self-pay | Source: Ambulatory Visit | Attending: Obstetrics & Gynecology | Admitting: Obstetrics & Gynecology

## 2015-10-19 ENCOUNTER — Encounter (HOSPITAL_COMMUNITY): Payer: Self-pay | Admitting: *Deleted

## 2015-10-19 DIAGNOSIS — Z3A31 31 weeks gestation of pregnancy: Secondary | ICD-10-CM | POA: Insufficient documentation

## 2015-10-19 DIAGNOSIS — O09521 Supervision of elderly multigravida, first trimester: Secondary | ICD-10-CM

## 2015-10-19 DIAGNOSIS — Z8759 Personal history of other complications of pregnancy, childbirth and the puerperium: Secondary | ICD-10-CM

## 2015-10-19 DIAGNOSIS — O0993 Supervision of high risk pregnancy, unspecified, third trimester: Secondary | ICD-10-CM

## 2015-10-19 DIAGNOSIS — O321XX0 Maternal care for breech presentation, not applicable or unspecified: Secondary | ICD-10-CM | POA: Insufficient documentation

## 2015-10-19 DIAGNOSIS — O09893 Supervision of other high risk pregnancies, third trimester: Secondary | ICD-10-CM

## 2015-10-19 DIAGNOSIS — O4693 Antepartum hemorrhage, unspecified, third trimester: Secondary | ICD-10-CM | POA: Insufficient documentation

## 2015-10-19 DIAGNOSIS — Z86718 Personal history of other venous thrombosis and embolism: Secondary | ICD-10-CM

## 2015-10-19 LAB — URINALYSIS, ROUTINE W REFLEX MICROSCOPIC
BILIRUBIN URINE: NEGATIVE
GLUCOSE, UA: NEGATIVE mg/dL
KETONES UR: NEGATIVE mg/dL
Nitrite: NEGATIVE
PH: 7 (ref 5.0–8.0)
PROTEIN: NEGATIVE mg/dL
Specific Gravity, Urine: <1.005 — ABNORMAL LOW (ref 1.005–1.030)

## 2015-10-19 LAB — URINE MICROSCOPIC-ADD ON: RBC / HPF: NONE SEEN RBC/hpf (ref 0–5)

## 2015-10-19 NOTE — Discharge Instructions (Signed)
Regresa a la departamente del emergencia is tiene Wellsite geologistmas sangre, especialmente  Hemorragia vaginal durante Firefighterel embarazo (tercer trimestre) (Vaginal Bleeding During Pregnancy, Third Trimester) Durante el embarazo es relativamente frecuente que se presente una pequea hemorragia (manchas). Hay diversos factores que pueden causar hemorragia o Games developermanchas en el embarazo. A veces, la hemorragia es normal y no es un problema. No obstante, si esto sucede durante el tercer trimestre, puede ser un signo de afeccin grave de la madre y el beb. Debe informar a su mdico de inmediato si tiene alguna hemorragia vaginal.  Algunas causas posibles de hemorragia vaginal durante el tercer trimestre incluyen:   La placenta puede estar cubriendo la apertura del tero de Gilbertmanera total o parcial (placenta previa).  La placenta puede haberse separado del tero (abrupcin placentaria).  Puede haber infeccin o una masa en el cuello del tero.  Puede ser que se est iniciando el trabajo de parto, entonces se denomina "prdida del tapn mucoso".  La placenta puede desarrollarse en la capa muscular del tero (placenta acreta). INSTRUCCIONES PARA EL CUIDADO EN EL HOGAR  Controle su afeccin para ver si hay cambios. Las siguientes indicaciones ayudarn a Psychologist, educationalaliviar cualquier Longs Drug Storesmolestia que pueda sentir:   Siga las indicaciones del mdico para restringir su actividad. Si el mdico le indica descanso en la cama, debe quedarse en la cama y levantarse solo para ir al bao. No obstante, el mdico puede permitirle que contine realizando tareas livianas.  Si es necesario, organcese para que alguien le ayude con las actividades y responsabilidades cotidianas mientras est en cama.  Lleve un registro de la cantidad y Scientist, forensicel grado de saturacin de las toallas higinicas que Landscape architectutiliza cada da. Anote este dato.  No use tampones.No se haga duchas vaginales.  No tenga relaciones sexuales u orgasmos hasta que el mdico la autorice.  Siga los  consejos del mdico acerca de levantar objetos pesados, conducir y Education officer, environmentalrealizar actividad fsica.  Si elimina tejido por la vagina, gurdelo para mostrrselo al American Expressmdico.  Idealome solo medicamentos de venta libre o recetados, segn las indicaciones del mdico.  No  tome aspirina, ya que puede causar hemorragias.  Cumpla con todas las visitas de control, segn le indique su mdico. SOLICITE ATENCIN MDICA SI:  Tiene un sangrado vaginal en cualquier momento del embarazo.  Tiene calambres o dolores de Breedsvilleparto.  Tiene fiebre y no puede bajarla con medicamentos. SOLICITE ATENCIN MDICA DE INMEDIATO SI:   Siente calambres intensos o dolor en la espalda o en el vientre (abdomen).  Tiene escalofros.  Tiene una prdida de lquido por la vagina.  Elimina cogulos grandes o tejido por la vagina.  La hemorragia aumenta.  Se siente mareada o dbil.  Se desmaya.  Siente que el beb se mueve menos o no se mueve en absoluto. ASEGRESE DE QUE:  Comprende estas instrucciones.  Controlar su afeccin.  Recibir ayuda de inmediato si no mejora o si empeora.   Esta informacin no tiene Theme park managercomo fin reemplazar el consejo del mdico. Asegrese de hacerle al mdico cualquier pregunta que tenga.   Document Released: 02/05/2005 Document Revised: 05/03/2013 Elsevier Interactive Patient Education [employer]2016 Elsevier Inc.

## 2015-10-19 NOTE — MAU Provider Note (Signed)
MAU HISTORY AND PHYSICAL Chief Complaint:  Vaginal Bleeding and Abdominal Pain  Maria Cook is a 40 y.o.  [phone] with IUP at [redacted]w[redacted]d presenting for Vaginal Bleeding and Abdominal Pain Prenatal risk factors: AMA, grand multipara, late to [facility] with transfer from [facility], H/O DVTduring pregnancy in 2008/2012/2017 currently on Lovenox, H/O GDM 2008/2012.  Pt states that she feel on her back on Saturday 10/13/2015 on some mud.  Around Monday 10/15/2015 began to have some light spotting, with dark blood/small clots, this was accompanied by intermittent contractions/lower back pain and increased fetal movement. Patient states she has been having Irregular contractions,  unable to tell frequency contractions, endorses  small vaginal bleeding- brown in color, no loss of fluid,  active fetal movement.    Past Medical History  Diagnosis Date  . Prior pregnancy complicated by DVT, antepartum 02/24/2011    2008 and 2012 on lovenox  . Gestational diabetes 02/24/2011    in pregnancies 2008 and 2012    History reviewed. No pertinent past surgical history.  Family History  Problem Relation Age of Onset  . Depression Father   . Diabetes Neg Hx   . Cancer Neg Hx   . Hypertension Neg Hx     Social History  Substance Use Topics  . Smoking status: Never Smoker   . Smokeless tobacco: Never Used  . Alcohol Use: No    No Known Allergies  Prescriptions prior to admission  Medication Sig Dispense Refill Last Dose  . enoxaparin (LOVENOX) 40 MG/0.4ML injection Inject 0.4 mLs (40 mg total) into the skin daily. 30 Syringe 6 10/18/2015 at 2200  . Prenatal Vit-Fe Fumarate-FA (PRENATAL VITAMINS PLUS) 27-1 MG TABS Take 1 tablet by mouth once. (Patient taking differently: Take 1 tablet by mouth daily. ) 30 tablet 2 10/18/2015 at Unknown time  . ferrous sulfate (FERROUSUL) 325 (65 FE) MG tablet Take 1 tablet (325 mg total) by mouth 2 (two) times daily. (Patient not taking: Reported on 10/11/2015) 60 tablet 1 Not Taking at  Unknown time  . HYDROcodone-acetaminophen (NORCO/VICODIN) 5-325 MG tablet Take 1-2 tablets by mouth every 4 (four) hours as needed for moderate pain or severe pain. (Patient not taking: Reported on 10/11/2015) 30 tablet 0 Not Taking at Unknown time  . promethazine (PHENERGAN) 25 MG tablet Take 1 tablet (25 mg total) by mouth every 4 (four) hours as needed for nausea or vomiting. (Patient not taking: Reported on 10/11/2015) 30 tablet 0 Not Taking at Unknown time    Review of Systems - Negative except for what is mentioned in HPI.  Physical Exam  Blood pressure 105/80, pulse 80, temperature 97.8 F (36.6 C), temperature source Oral, resp. rate 16, last menstrual period 02/23/2015. GENERAL: Well-developed, well-nourished female in no acute distress.  LUNGS: Clear to auscultation bilaterally.  HEART: Regular rate and rhythm. ABDOMEN: Soft, nontender, nondistended, gravid.  EXTREMITIES: Nontender, no edema, 2+ distal pulses. Cervical Exam: Dark blood in vaginal vault. 1 cm dark clot removed from vaginal vault.  No active bleeding/red blood.  Presentation: breech frank (U/s on 10/11/2015) FHT: Baseline 130, mod variability, +acels, no decels Contractions: Every 4-5 min   Labs: Results for orders placed or performed during the hospital encounter of 10/19/15 (from the past 24 hour(s))  Urinalysis, Routine w reflex microscopic (not at [facility])   Collection Time: 10/19/15  1:00 PM  Result Value Ref Range   Color, Urine YELLOW YELLOW   APPearance CLEAR CLEAR   Specific Gravity, Urine <1.005 (L) 1.005 - 1.030  pH 7.0 5.0 - 8.0   Glucose, UA NEGATIVE NEGATIVE mg/dL   Hgb urine dipstick MODERATE (A) NEGATIVE   Bilirubin Urine NEGATIVE NEGATIVE   Ketones, ur NEGATIVE NEGATIVE mg/dL   Protein, ur NEGATIVE NEGATIVE mg/dL   Nitrite NEGATIVE NEGATIVE   Leukocytes, UA SMALL (A) NEGATIVE  Urine microscopic-add on   Collection Time: 10/19/15  1:00 PM  Result Value Ref Range   Squamous Epithelial / LPF 0-5  (A) NONE SEEN   WBC, UA 0-5 0 - 5 WBC/hpf   RBC / HPF NONE SEEN 0 - 5 RBC/hpf   Bacteria, UA FEW (A) NONE SEEN    Imaging Studies:  [place] [facility] Ob Follow Up  10/11/2015  OBSTETRICAL ULTRASOUND: This exam was performed within a [place] Ultrasound Department. The OB US report was generated in the AS system, and faxed to the ordering physician.  This report is available in the [facility] [employer] PACS. See the AS Obstetric US report via the Image Link. 10/11/2015: AFI 17 (largest pocket 6cm), EFW 1568gm (3Lb 7Oz) 61%tile, normal anatomy, placenta anterior above cervical os, frank breech presentation  Assessment: Maria Cook is  40 y.o. [phone] at 1027w1d presents with Vaginal Bleeding and Abdominal Pain  Plan: #Bleeding in third trimester -Ddx: placenta abruption, placenta previa, uterine rupture, vasa previa, vaginal laceration -SVE completed by Attending, mod brown clot noted but cervical os closed and no bleeding noted -At this time probability of abruption low risk, labor precautions and pre-term bleeding discussed with pt  -D/c to home, routine f/u with OB provider  Olena LeatherwoodKelly M Aguilar 6/9/20171:40 PM   OB fellow attestation: I have seen and examined this patient; I agree with above documentation in the resident's note.   Maria Cook is a 40 y.o. [phone] reporting vaginal bleeding, brown +FM, denies LOF, VB, contractions, vaginal discharge.  PE: BP 111/77 mmHg  Pulse 70  Temp(Src) 97.8 F (36.6 C) (Oral)  Resp 16  LMP 02/23/2015 (Exact Date) Gen: calm comfortable, NAD Resp: normal effort, no distress Abd: gravid GU: Dark brown clot in vaginal vault. Not copious. No BRB.  ROS, labs, PMH reviewed NST reactive Toco: q3-5 min contractions, not feeling them.    Plan: #Vaginal Bleeding, third trimester:  Likely from mild stretch injury with persistence of sx due to anticoagulation.  Not previa given documented anterior placenta above cervical os. Less likely abruption given lack  of associated pain. This might be a marginal abruption but given overall stability this is unlikely. Given reactive NST, lack of BRB, and > 5 days from incident I feel this patient is safe to be discharged home. We discussed return precautions in detail.   #Breech: infant was breech on US 6/1 and on my leopolds today. Monitor in outpatient for cephalic presentation. Consider ECV vs vaginal breech delivery if appropriate.   Federico FlakeKimberly Niles Corinna Burkman, MD , MPH, ABFM Family Medicine, OB Fellow [facility]

## 2015-10-19 NOTE — MAU Note (Signed)
Pt states she slipped and fell last Sunday and started bleeding.  Last night she started having contraction like pain and has not stopped bleeding yet.

## 2015-10-20 ENCOUNTER — Inpatient Hospital Stay (HOSPITAL_COMMUNITY): Payer: Medicaid Other

## 2015-10-20 ENCOUNTER — Inpatient Hospital Stay (HOSPITAL_COMMUNITY)
Admission: AD | Admit: 2015-10-20 | Discharge: 2015-10-21 | DRG: 774 | Disposition: A | Discharge status: Home / Self Care | Payer: Medicaid Other | Source: Ambulatory Visit | Attending: Family Medicine | Admitting: Family Medicine

## 2015-10-20 ENCOUNTER — Encounter (HOSPITAL_COMMUNITY): Payer: Self-pay | Admitting: *Deleted

## 2015-10-20 DIAGNOSIS — O09523 Supervision of elderly multigravida, third trimester: Secondary | ICD-10-CM

## 2015-10-20 DIAGNOSIS — Z818 Family history of other mental and behavioral disorders: Secondary | ICD-10-CM

## 2015-10-20 DIAGNOSIS — Z560 Unemployment, unspecified: Secondary | ICD-10-CM

## 2015-10-20 DIAGNOSIS — Z3A31 31 weeks gestation of pregnancy: Secondary | ICD-10-CM | POA: Diagnosis present

## 2015-10-20 DIAGNOSIS — O4593 Premature separation of placenta, unspecified, third trimester: Secondary | ICD-10-CM | POA: Diagnosis present

## 2015-10-20 DIAGNOSIS — O4690 Antepartum hemorrhage, unspecified, unspecified trimester: Secondary | ICD-10-CM | POA: Diagnosis present

## 2015-10-20 DIAGNOSIS — Z7901 Long term (current) use of anticoagulants: Secondary | ICD-10-CM

## 2015-10-20 DIAGNOSIS — Z79899 Other long term (current) drug therapy: Secondary | ICD-10-CM

## 2015-10-20 DIAGNOSIS — O458X3 Other premature separation of placenta, third trimester: Secondary | ICD-10-CM

## 2015-10-20 DIAGNOSIS — Z86718 Personal history of other venous thrombosis and embolism: Secondary | ICD-10-CM | POA: Diagnosis not present

## 2015-10-20 DIAGNOSIS — O9962 Diseases of the digestive system complicating childbirth: Secondary | ICD-10-CM

## 2015-10-20 DIAGNOSIS — K8080 Other cholelithiasis without obstruction: Secondary | ICD-10-CM

## 2015-10-20 DIAGNOSIS — O4693 Antepartum hemorrhage, unspecified, third trimester: Secondary | ICD-10-CM

## 2015-10-20 DIAGNOSIS — O4703 False labor before 37 completed weeks of gestation, third trimester: Secondary | ICD-10-CM

## 2015-10-20 DIAGNOSIS — Z8632 Personal history of gestational diabetes: Secondary | ICD-10-CM

## 2015-10-20 DIAGNOSIS — Z8759 Personal history of other complications of pregnancy, childbirth and the puerperium: Secondary | ICD-10-CM

## 2015-10-20 LAB — URINALYSIS, ROUTINE W REFLEX MICROSCOPIC
Bilirubin Urine: NEGATIVE
Glucose, UA: NEGATIVE mg/dL
Ketones, ur: >80 mg/dL — AB
LEUKOCYTES UA: NEGATIVE
NITRITE: NEGATIVE
PROTEIN: NEGATIVE mg/dL
SPECIFIC GRAVITY, URINE: 1.01 (ref 1.005–1.030)
pH: 7.5 (ref 5.0–8.0)

## 2015-10-20 LAB — WET PREP, GENITAL
CLUE CELLS WET PREP: NONE SEEN
SPERM: NONE SEEN
TRICH WET PREP: NONE SEEN
Yeast Wet Prep HPF POC: NONE SEEN

## 2015-10-20 LAB — CBC
HCT: 34.4 % — ABNORMAL LOW (ref 36.0–46.0)
HEMOGLOBIN: 11.7 g/dL — AB (ref 12.0–15.0)
MCH: 30.2 pg (ref 26.0–34.0)
MCHC: 34 g/dL (ref 30.0–36.0)
MCV: 88.7 fL (ref 78.0–100.0)
PLATELETS: 297 10*3/uL (ref 150–400)
RBC: 3.88 MIL/uL (ref 3.87–5.11)
RDW: 14.2 % (ref 11.5–15.5)
WBC: 11.3 10*3/uL — ABNORMAL HIGH (ref 4.0–10.5)

## 2015-10-20 LAB — TYPE AND SCREEN
ABO/RH(D): O POS
Antibody Screen: NEGATIVE

## 2015-10-20 LAB — URINE MICROSCOPIC-ADD ON

## 2015-10-20 MED ORDER — OXYTOCIN 40 UNITS IN LACTATED RINGERS INFUSION - SIMPLE MED
INTRAVENOUS | Status: AC
  Filled 2015-10-20: qty 1000

## 2015-10-20 MED ORDER — DIPHENHYDRAMINE HCL 25 MG PO CAPS: 25.0000 mg | ORAL_CAPSULE | Freq: Four times a day (QID) | ORAL | Status: DC | PRN

## 2015-10-20 MED ORDER — DOCUSATE SODIUM 100 MG PO CAPS
100.0000 mg | ORAL_CAPSULE | Freq: Every day | ORAL | Status: DC
  Filled 2015-10-20: qty 1

## 2015-10-20 MED ORDER — PENICILLIN G POTASSIUM 5000000 UNITS IJ SOLR
5.0000 10*6.[IU] | Freq: Once | INTRAMUSCULAR | Status: DC
  Filled 2015-10-20: qty 5

## 2015-10-20 MED ORDER — MAGNESIUM SULFATE 50 % IJ SOLN
2.0000 g/h | INTRAVENOUS | Status: DC
  Administered 2015-10-20: 4 g/h via INTRAVENOUS
  Filled 2015-10-20: qty 80

## 2015-10-20 MED ORDER — CALCIUM CARBONATE ANTACID 500 MG PO CHEW
2.0000 | CHEWABLE_TABLET | ORAL | Status: DC | PRN
  Filled 2015-10-20: qty 2

## 2015-10-20 MED ORDER — PRENATAL MULTIVITAMIN CH: 1.0000 | ORAL_TABLET | Freq: Every day | ORAL | Status: DC

## 2015-10-20 MED ORDER — ZOLPIDEM TARTRATE 5 MG PO TABS: 5.0000 mg | ORAL_TABLET | Freq: Every evening | ORAL | Status: DC | PRN

## 2015-10-20 MED ORDER — BISACODYL 10 MG RE SUPP: 10.0000 mg | Freq: Every day | RECTAL | Status: DC | PRN

## 2015-10-20 MED ORDER — ONDANSETRON HCL 4 MG PO TABS: 4.0000 mg | ORAL_TABLET | ORAL | Status: DC | PRN

## 2015-10-20 MED ORDER — COCONUT OIL OIL
1.0000 "application " | TOPICAL_OIL | Status: DC | PRN
  Administered 2015-10-20: 1 via TOPICAL
  Filled 2015-10-20: qty 120

## 2015-10-20 MED ORDER — SODIUM CHLORIDE 0.9% FLUSH: 3.0000 mL | INTRAVENOUS | Status: DC | PRN

## 2015-10-20 MED ORDER — NIFEDIPINE 10 MG PO CAPS
10.0000 mg | ORAL_CAPSULE | ORAL | Status: AC | PRN
  Administered 2015-10-20 (×3): 10 mg via ORAL
  Filled 2015-10-20 (×3): qty 1

## 2015-10-20 MED ORDER — PENICILLIN G POTASSIUM 5000000 UNITS IJ SOLR
2.5000 10*6.[IU] | INTRAVENOUS | Status: DC
  Filled 2015-10-20 (×4): qty 2.5

## 2015-10-20 MED ORDER — MEASLES, MUMPS & RUBELLA VAC ~~LOC~~ INJ
0.5000 mL | INJECTION | Freq: Once | SUBCUTANEOUS | Status: DC
  Filled 2015-10-20: qty 0.5

## 2015-10-20 MED ORDER — BETAMETHASONE SOD PHOS & ACET 6 (3-3) MG/ML IJ SUSP
12.0000 mg | INTRAMUSCULAR | Status: DC
  Administered 2015-10-20: 12 mg via INTRAMUSCULAR
  Filled 2015-10-20 (×2): qty 2

## 2015-10-20 MED ORDER — PRENATAL MULTIVITAMIN CH
1.0000 | ORAL_TABLET | Freq: Every day | ORAL | Status: DC
  Filled 2015-10-20: qty 1

## 2015-10-20 MED ORDER — LACTATED RINGERS IV SOLN
INTRAVENOUS | Status: DC
  Administered 2015-10-20: 09:00:00 via INTRAVENOUS

## 2015-10-20 MED ORDER — ENOXAPARIN SODIUM 40 MG/0.4ML ~~LOC~~ SOLN
40.0000 mg | SUBCUTANEOUS | Status: DC
  Filled 2015-10-20: qty 0.4

## 2015-10-20 MED ORDER — ONDANSETRON HCL 4 MG/2ML IJ SOLN: 4.0000 mg | INTRAMUSCULAR | Status: DC | PRN

## 2015-10-20 MED ORDER — ACETAMINOPHEN 325 MG PO TABS: 650.0000 mg | ORAL_TABLET | ORAL | Status: DC | PRN

## 2015-10-20 MED ORDER — SENNOSIDES-DOCUSATE SODIUM 8.6-50 MG PO TABS
2.0000 | ORAL_TABLET | ORAL | Status: DC
  Administered 2015-10-20: 2 via ORAL
  Filled 2015-10-20: qty 2

## 2015-10-20 MED ORDER — IBUPROFEN 600 MG PO TABS
600.0000 mg | ORAL_TABLET | Freq: Four times a day (QID) | ORAL | Status: DC
  Administered 2015-10-20 – 2015-10-21 (×4): 600 mg via ORAL
  Filled 2015-10-20 (×4): qty 1

## 2015-10-20 MED ORDER — BENZOCAINE-MENTHOL 20-0.5 % EX AERO: 1.0000 "application " | INHALATION_SPRAY | CUTANEOUS | Status: DC | PRN

## 2015-10-20 MED ORDER — MAGNESIUM SULFATE BOLUS VIA INFUSION
4.0000 g | Freq: Once | INTRAVENOUS | Status: DC
Start: 2015-10-20 — End: 2015-10-20
  Filled 2015-10-20: qty 500

## 2015-10-20 MED ORDER — SODIUM CHLORIDE 0.9 % IV SOLN: 250.0000 mL | INTRAVENOUS | Status: DC | PRN

## 2015-10-20 MED ORDER — WITCH HAZEL-GLYCERIN EX PADS: 1.0000 "application " | MEDICATED_PAD | CUTANEOUS | Status: DC | PRN

## 2015-10-20 MED ORDER — FERROUS SULFATE 325 (65 FE) MG PO TABS
325.0000 mg | ORAL_TABLET | Freq: Two times a day (BID) | ORAL | Status: DC
  Filled 2015-10-20 (×3): qty 1

## 2015-10-20 MED ORDER — ENOXAPARIN SODIUM 40 MG/0.4ML ~~LOC~~ SOLN
40.0000 mg | SUBCUTANEOUS | Status: DC
  Administered 2015-10-20: 40 mg via SUBCUTANEOUS
  Filled 2015-10-20 (×2): qty 0.4

## 2015-10-20 MED ORDER — BUTORPHANOL TARTRATE 1 MG/ML IJ SOLN
1.0000 mg | Freq: Once | INTRAMUSCULAR | Status: AC
  Administered 2015-10-20: 1 mg via INTRAVENOUS
  Filled 2015-10-20: qty 1

## 2015-10-20 MED ORDER — FLEET ENEMA 7-19 GM/118ML RE ENEM: 1.0000 | ENEMA | Freq: Every day | RECTAL | Status: DC | PRN

## 2015-10-20 MED ORDER — SIMETHICONE 80 MG PO CHEW: 80.0000 mg | CHEWABLE_TABLET | ORAL | Status: DC | PRN

## 2015-10-20 MED ORDER — LACTATED RINGERS IV SOLN: INTRAVENOUS | Status: DC

## 2015-10-20 MED ORDER — LACTATED RINGERS IV BOLUS (SEPSIS)
1000.0000 mL | Freq: Once | INTRAVENOUS | Status: AC
  Administered 2015-10-20: 1000 mL via INTRAVENOUS

## 2015-10-20 MED ORDER — DIBUCAINE 1 % RE OINT: 1.0000 "application " | TOPICAL_OINTMENT | RECTAL | Status: DC | PRN

## 2015-10-20 MED ORDER — TETANUS-DIPHTH-ACELL PERTUSSIS 5-2.5-18.5 LF-MCG/0.5 IM SUSP: 0.5000 mL | Freq: Once | INTRAMUSCULAR | Status: DC

## 2015-10-20 MED ORDER — SODIUM CHLORIDE 0.9% FLUSH
3.0000 mL | Freq: Two times a day (BID) | INTRAVENOUS | Status: DC
  Administered 2015-10-20: 3 mL via INTRAVENOUS

## 2015-10-20 MED ORDER — LIDOCAINE HCL (PF) 1 % IJ SOLN
INTRAMUSCULAR | Status: DC
Start: 2015-10-20 — End: 2015-10-20
  Filled 2015-10-20: qty 30

## 2015-10-20 NOTE — MAU Provider Note (Signed)
MAU HISTORY AND PHYSICAL  Chief Complaint:  Contractions  Maria Cook is a 40 y.o.  [phone] with IUP at [redacted]w[redacted]d presenting for Contractions. Patient reports contraction pain for two nights. She says her contraction has gotten stronger and more frequent tonight, about every 5 minutes. She reports vaginal bleeding. She says the vaginal bleeding is not profuse. It was noted on exam here. She says was seen here, and was told "everything is normal" and discharged home. She denies gush or leak of fluid. Reports baby is moving but less than yesterday. Denies trauma or fall although she reports near fall incident about a week ago when she slipped on wet floor. She denies dysuria.  She is taking Lovenox 40 mg daily. She didn't not use last night.  Denies dysuria or fever.   Past Medical History  Diagnosis Date  . Prior pregnancy complicated by DVT, antepartum 02/24/2011    2008 and 2012 on lovenox  . Gestational diabetes 02/24/2011    in pregnancies 2008 and 2012    No past surgical history on file.  Family History  Problem Relation Age of Onset  . Depression Father   . Diabetes Neg Hx   . Cancer Neg Hx   . Hypertension Neg Hx     Social History  Substance Use Topics  . Smoking status: Never Smoker   . Smokeless tobacco: Never Used  . Alcohol Use: No    No Known Allergies  Prescriptions prior to admission  Medication Sig Dispense Refill Last Dose  . enoxaparin (LOVENOX) 40 MG/0.4ML injection Inject 0.4 mLs (40 mg total) into the skin daily. 30 Syringe 6 Past Week at Unknown time  . Prenatal Vit-Fe Fumarate-FA (PRENATAL VITAMINS PLUS) 27-1 MG TABS Take 1 tablet by mouth once. (Patient taking differently: Take 1 tablet by mouth daily. ) 30 tablet 2 10/19/2015 at Unknown time  . ferrous sulfate (FERROUSUL) 325 (65 FE) MG tablet Take 1 tablet (325 mg total) by mouth 2 (two) times daily. (Patient not taking: Reported on 10/11/2015) 60 tablet 1 Not Taking at Unknown time  .  HYDROcodone-acetaminophen (NORCO/VICODIN) 5-325 MG tablet Take 1-2 tablets by mouth every 4 (four) hours as needed for moderate pain or severe pain. (Patient not taking: Reported on 10/11/2015) 30 tablet 0 Not Taking at Unknown time  . promethazine (PHENERGAN) 25 MG tablet Take 1 tablet (25 mg total) by mouth every 4 (four) hours as needed for nausea or vomiting. (Patient not taking: Reported on 10/11/2015) 30 tablet 0 Not Taking at Unknown time    Review of Systems - Negative except for what is mentioned in HPI.  Physical Exam  Blood pressure 123/79, pulse 80, temperature 97.6 F (36.4 C), resp. rate 18, height 5' 1.75" (1.568 m), weight 143 lb 12.8 oz (65.227 kg), last menstrual period 02/23/2015. GENERAL: Well-developed, well-nourished female in no acute distress.  LUNGS: Clear to auscultation bilaterally.  HEART: Regular rate and rhythm. ABDOMEN: Soft, nontender, nondistended, gravid.  EXTREMITIES: Nontender, no edema, 2+ distal pulses. Speculum exam: dark blood stained mucus out of her cervix, no profuse or bright red blood Cervical Exam: finger tip, thick FHT:  140/reactive Contractions: irregular contraction on toco   Labs: Results for orders placed or performed during the hospital encounter of 10/19/15 (from the past 24 hour(s))  Urinalysis, Routine w reflex microscopic (not at [facility])   Collection Time: 10/19/15  1:00 PM  Result Value Ref Range   Color, Urine YELLOW YELLOW   APPearance CLEAR CLEAR   Specific  Gravity, Urine <1.005 (L) 1.005 - 1.030   pH 7.0 5.0 - 8.0   Glucose, UA NEGATIVE NEGATIVE mg/dL   Hgb urine dipstick MODERATE (A) NEGATIVE   Bilirubin Urine NEGATIVE NEGATIVE   Ketones, ur NEGATIVE NEGATIVE mg/dL   Protein, ur NEGATIVE NEGATIVE mg/dL   Nitrite NEGATIVE NEGATIVE   Leukocytes, UA SMALL (A) NEGATIVE  Urine microscopic-add on   Collection Time: 10/19/15  1:00 PM  Result Value Ref Range   Squamous Epithelial / LPF 0-5 (A) NONE SEEN   WBC, UA 0-5 0 - 5  WBC/hpf   RBC / HPF NONE SEEN 0 - 5 RBC/hpf   Bacteria, UA FEW (A) NONE SEEN    Imaging Studies:  [place] [facility] Ob Follow Up  10/11/2015  OBSTETRICAL ULTRASOUND: This exam was performed within a [place] [facility] Department. The OB US report was generated in the AS system, and faxed to the ordering physician.  This report is available in the [employer] PACS. See the AS Obstetric US report via the Image Link.   Assessment: Maria Cook is  40 y.o. [phone] at 6965w2d presents with contractions and dark bloody mucus. Patient with history of DVT and on Lovenox. She didn't take her dose last night. Patient could have mild abruption. However, she is not tender to palpation. Cervix is only finger tip and thick. Contraction is irregular. Fetal wellbeing reassuring. UA with ketonuria to > 80. Gave a litter of LR and procardia 10 mg q1920mins x3. Contraction gotten stronger. US without abruption or previa.   Plan: Admit to antenatal unit LR at 125 ml/hr Pain meds as needed   Aeva Posey T Charrisse Masley 6/10/20175:23 AM

## 2015-10-20 NOTE — Progress Notes (Signed)
Dr. Alanda SlimGonfa notified that pt vomited a small amount and pt is tender to the touch over her entire abdomen.  Provider asked to come re-evaluate the pt in person.  Provider states someone will come see her.

## 2015-10-20 NOTE — H&P (Signed)
Maria Cook is a 40 y.o. [phone] female at [redacted]w[redacted]d by 15wk u/s, presenting w/ report of contractions and vaginal bleeding x 2 days- was seen in mau yesterday w/ same, cx was closed, felt stable for d/c. States pain continued to worsen during the night, so she returned this am for re-evaluation.    Reports active but slightly decreased fetal movement, contractions: regular, every 5 minutes, vaginal bleeding: small amt bright red, membranes: intact. Initiated prenatal care at [facility] at 18 wks as tx from HD d/t h/o DVT.   Most recent u/s this morning- AFI normal, vtx, no evidence of abruption/previa, unable to obtain CL d/t >29wks.   Pregnancy hx/complications:  H/O DVT in prior pregnancy- currently on Lovenox 40 q hs- she did not take last night d/t pain AMA, 40yo, Quad neg, declined NIPS H/O GDM x 2, early 1hr abnormal 3hr normal, has not had 3rd trimester repeat yet Cholelithiasis in 2nd trimester, improved currently Liver masses x 2 favoring hemangioma on 09/09/15 gallbladder u/s w/ normal LFTs, radiologist recommended repeat u/s in   Past Medical History: Past Medical History  Diagnosis Date  . Prior pregnancy complicated by DVT, antepartum 02/24/2011    2008 and 2012 on lovenox  . Gestational diabetes 02/24/2011    in pregnancies 2008 and 2012    Past Surgical History: History reviewed. No pertinent past surgical history.  Obstetrical History: OB History    Gravida Para Term Preterm AB TAB SAB Ectopic Multiple Living   0 0 0 0 0 0 6      Social History: Social History   Social History  . Marital Status: Legally Separated    Spouse Name: N/A  . Number of Children: N/A  . Years of Education: N/A   Social History Main Topics  . Smoking status: Never Smoker   . Smokeless tobacco: Never Used  . Alcohol Use: No  . Drug Use: No  . Sexual Activity: Yes    Birth Control/ Protection: None   Other Topics Concern  . None   Social History Narrative   Moved  from [place] in 2013;  Originally from [place]  Lives with father of her two youngest children.  Has 3 children in the [place], three children back in [place]  Unemployed.      Family History: Family History  Problem Relation Age of Onset  . Depression Father   . Diabetes Neg Hx   . Cancer Neg Hx   . Hypertension Neg Hx     Allergies: No Known Allergies  Prescriptions prior to admission  Medication Sig Dispense Refill Last Dose  . enoxaparin (LOVENOX) 40 MG/0.4ML injection Inject 0.4 mLs (40 mg total) into the skin daily. 30 Syringe 6 Past Week at Unknown time  . ferrous sulfate (FERROUSUL) 325 (65 FE) MG tablet Take 1 tablet (325 mg total) by mouth 2 (two) times daily. 60 tablet 1 Past Week at Unknown time  . Prenatal Vit-Fe Fumarate-FA (PRENATAL VITAMINS PLUS) 27-1 MG TABS Take 1 tablet by mouth once. (Patient taking differently: Take 1 tablet by mouth daily. ) 30 tablet 2 10/19/2015 at Unknown time  . HYDROcodone-acetaminophen (NORCO/VICODIN) 5-325 MG tablet Take 1-2 tablets by mouth every 4 (four) hours as needed for moderate pain or severe pain. (Patient not taking: Reported on 10/11/2015) 30 tablet 0 Not Taking at Unknown time  . promethazine (PHENERGAN) 25 MG tablet Take 1 tablet (25 mg total) by mouth every 4 (four) hours as needed  for nausea or vomiting. (Patient not taking: Reported on 10/11/2015) 30 tablet 0 Not Taking at Unknown time    Review of Systems  Pertinent pos/neg as indicated in HPI  Blood pressure 113/76, pulse 95, temperature 97.6 F (36.4 C), resp. rate 18, height 5' 1.75" (1.568 m), weight 65.227 kg (143 lb 12.8 oz), last menstrual period 02/23/2015, SpO2 98 %. General appearance: alert, cooperative and mild distress Lungs: clear to auscultation bilaterally Heart: regular rate and rhythm Abdomen: gravid, soft, lower abdomen tender to palpation SVE: checked earlier x 2 by Candelaria Stagers, medical student, cx closed-ft/thick Small amt brb on pad Extremities: No  edema DTR's 2+  Fetal monitoring: FHR: 125 bpm, variability: moderate,  Accelerations: Present,  decelerations:  Absent Uterine activity: q 2-52mins  Dilation: Fingertip Exam by:: Dr. Alanda Slim Presentation: cephalic by this morning u/s  MAU Course:  CBC, UA, wet prep, gc/ct, gbs collected. She received 3 doses of po procardia and 1 L LR bolus for uc's w/o much improvement, she received 1 dose IV stadol d/t pain. Small amt brb w/o cervical change, d/t pain and continued uc's will admit for observation. NICU full, spoke w/ Dr. Algernon Huxley who states ok to keep pt here- if delivers may need to transfer baby to [facility].   Prenatal labs: ABO, Rh: --/--/O POS (06/10 1000) Antibody: PENDING (06/10 1000) Rubella: !Error! RPR: Nonreactive (02/20 0000)  HBsAg: Negative (02/20 0000)  HIV: Non-reactive (02/20 0000)  GBS:   unknown, collected  Early 1 hr Glucola: 149, 3hr normal 74/188/152/118, has not had repeat 3rd trimester screening Genetic screening:  Quad neg, declined NIPS Anatomy US: normal female  Results for orders placed or performed during the hospital encounter of 10/20/15 (from the past 24 hour(s))  Urinalysis, Routine w reflex microscopic (not at [facility])   Collection Time: 10/20/15  5:55 AM  Result Value Ref Range   Color, Urine YELLOW YELLOW   APPearance CLEAR CLEAR   Specific Gravity, Urine 1.010 1.005 - 1.030   pH 7.5 5.0 - 8.0   Glucose, UA NEGATIVE NEGATIVE mg/dL   Hgb urine dipstick SMALL (A) NEGATIVE   Bilirubin Urine NEGATIVE NEGATIVE   Ketones, ur >80 (A) NEGATIVE mg/dL   Protein, ur NEGATIVE NEGATIVE mg/dL   Nitrite NEGATIVE NEGATIVE   Leukocytes, UA NEGATIVE NEGATIVE  Urine microscopic-add on   Collection Time: 10/20/15  5:55 AM  Result Value Ref Range   Squamous Epithelial / LPF 0-5 (A) NONE SEEN   WBC, UA 0-5 0 - 5 WBC/hpf   RBC / HPF 0-5 0 - 5 RBC/hpf   Bacteria, UA RARE (A) NONE SEEN   Urine-Other MUCOUS PRESENT   CBC   Collection Time: 10/20/15  6:30 AM   Result Value Ref Range   WBC 11.3 (H) 4.0 - 10.5 K/uL   RBC 3.88 3.87 - 5.11 MIL/uL   Hemoglobin 11.7 (L) 12.0 - 15.0 g/dL   HCT 16.1 (L) 09.6 - 04.5 %   MCV 88.7 78.0 - 100.0 fL   MCH 30.2 26.0 - 34.0 pg   MCHC 34.0 30.0 - 36.0 g/dL   RDW 40.9 81.1 - 91.4 %   Platelets 297 150 - 400 K/uL  Type and screen [facility]   Collection Time: 10/20/15 10:00 AM  Result Value Ref Range   ABO/RH(D) O POS    Antibody Screen PENDING    Sample Expiration 10/23/2015   Results for orders placed or performed during the hospital encounter of 10/19/15 (from the past 24  hour(s))  Urinalysis, Routine w reflex microscopic (not at [facility])   Collection Time: 10/19/15  1:00 PM  Result Value Ref Range   Color, Urine YELLOW YELLOW   APPearance CLEAR CLEAR   Specific Gravity, Urine <1.005 (L) 1.005 - 1.030   pH 7.0 5.0 - 8.0   Glucose, UA NEGATIVE NEGATIVE mg/dL   Hgb urine dipstick MODERATE (A) NEGATIVE   Bilirubin Urine NEGATIVE NEGATIVE   Ketones, ur NEGATIVE NEGATIVE mg/dL   Protein, ur NEGATIVE NEGATIVE mg/dL   Nitrite NEGATIVE NEGATIVE   Leukocytes, UA SMALL (A) NEGATIVE  Urine microscopic-add on   Collection Time: 10/19/15  1:00 PM  Result Value Ref Range   Squamous Epithelial / LPF 0-5 (A) NONE SEEN   WBC, UA 0-5 0 - 5 WBC/hpf   RBC / HPF NONE SEEN 0 - 5 RBC/hpf   Bacteria, UA FEW (A) NONE SEEN     Assessment:  3947w2d SIUP  [id number]  Presumed partial marginal abruption  Preterm uc's  Cat 1 FHR  GBS  pending  H/O DVT, currently on Lovenox  AMA  H/O GDM x 2  2nd trimester cholelithiasis now improved, u/s at time showed 2 liver masses favoring hemangioma  Plan:  Discussed w/ Dr. Adrian BlackwaterStinson  Admit to ante  Mag 4g bolus then 2g/hr for preterm uc's/cp prophylaxis  BMZ 12mg  now and again in 24hrs  PCN protocol for gbs unknown/preterm  Needs f/u liver u/s ~03/10/16   Marge DuncansBooker, Kynzley Dowson Randall CNM, WHNP-BC 10/20/2015, 10:36 AM

## 2015-10-20 NOTE — MAU Note (Signed)
Contractions since 1700 Friday. Scant bloody show.

## 2015-10-20 NOTE — H&P (Signed)
LABOR ADMISSION HISTORY AND PHYSICAL  Maria Cook is a 40 y.o. female [phone] with IUP at 4040w2d by early US admitted for Contractions.  Patient reports contraction pain for two nights. She says her contraction has gotten stronger and more frequent tonight, about every 5 minutes. She reports vaginal bleeding. She says the vaginal bleeding is not profuse. It was noted on exam here. She says was seen here, and was told "everything is normal" and discharged home.  She denies gush or leak of fluid. Reports baby is moving but less than yesterday. Denies trauma or fall although she reports near fall incident about a week ago when she slipped on wet floor. She denies dysuria.  She is taking Lovenox 40 mg daily. She didn't not use last night.  Denies dysuria or fever.     Prenatal History/Complications: Clinic  High Risk>transfer [facility] - HX DVT Prenatal Labs  Dating  U/S Blood type: O/Positive/-- (02/20 0000)   Genetic Screen Quad: negative Antibody:Negative (02/20 0000)  Anatomic US  Normal @ 18 wks, female Rubella: Immune (02/20 0000)  GTT Early:       149       3 hour = 74-188-152-118  > nml RPR: Nonreactive (02/20 0000)   Flu vaccine  07/23/15 HBsAg: Negative (02/20 0000)   TDaP vaccine  09/20/15                                  Rhogam: HIV: Non-reactive (02/20 0000)   Baby Food  bottle                                  GBS: (For PCN allergy, check sensitivities)  Contraception  Does not desire IUD> NFP Pap: negative  Circumcision  declines UDS: negative  Pediatrician  [facility] Cystic Fibrosis: negative  Support Person  Delores (sister) Hgb Electrophoresis: normal   Past Medical History: Past Medical History  Diagnosis Date  . Prior pregnancy complicated by DVT, antepartum 02/24/2011    2008 and 2012 on lovenox  . Gestational diabetes 02/24/2011    in pregnancies 2008 and 2012    Past Surgical History: No past surgical history on file.  Obstetrical History: OB History     Gravida Para Term Preterm AB TAB SAB Ectopic Multiple Living   7 6 6  0 0 0 0 0 0 6      Social History: Social History   Social History  . Marital Status: Legally Separated    Spouse Name: N/A  . Number of Children: N/A  . Years of Education: N/A   Social History Main Topics  . Smoking status: Never Smoker   . Smokeless tobacco: Never Used  . Alcohol Use: No  . Drug Use: No  . Sexual Activity: Yes    Birth Control/ Protection: None   Other Topics Concern  . Not on file   Social History Narrative   Moved from [place] in 2013;  Originally from [place].  Lives with father of her two youngest children.  Has 3 children in the [place], three children back in [place].  Unemployed.      Family History: Family History  Problem Relation Age of Onset  . Depression Father   . Diabetes Neg Hx   . Cancer Neg Hx   . Hypertension Neg Hx     Allergies:  No Known Allergies  Prescriptions prior to admission  Medication Sig Dispense Refill Last Dose  . enoxaparin (LOVENOX) 40 MG/0.4ML injection Inject 0.4 mLs (40 mg total) into the skin daily. 30 Syringe 6 Past Week at Unknown time  . ferrous sulfate (FERROUSUL) 325 (65 FE) MG tablet Take 1 tablet (325 mg total) by mouth 2 (two) times daily. 60 tablet 1 Past Week at Unknown time  . Prenatal Vit-Fe Fumarate-FA (PRENATAL VITAMINS PLUS) 27-1 MG TABS Take 1 tablet by mouth once. (Patient taking differently: Take 1 tablet by mouth daily. ) 30 tablet 2 10/19/2015 at Unknown time  . HYDROcodone-acetaminophen (NORCO/VICODIN) 5-325 MG tablet Take 1-2 tablets by mouth every 4 (four) hours as needed for moderate pain or severe pain. (Patient not taking: Reported on 10/11/2015) 30 tablet 0 Not Taking at Unknown time  . promethazine (PHENERGAN) 25 MG tablet Take 1 tablet (25 mg total) by mouth every 4 (four) hours as needed for nausea or vomiting. (Patient not taking: Reported on 10/11/2015) 30 tablet 0 Not Taking at Unknown time     Review of  Systems  Blood pressure 113/76, pulse 95, temperature 97.6 F (36.4 C), resp. rate 18, height 5' 1.75" (1.568 m), weight 143 lb 12.8 oz (65.227 kg), last menstrual period 02/23/2015, SpO2 98 %. GEN: appearance: alert, appears to be in a lot of pain RESP: clear to auscultation bilaterally, no increased WOB CVS:: regular rate and rhythm, no murmurs, no sign of DVT, +2 DP Abdomen: gravid, tender to palpation over low abdomen MSK: WWP, Homans sign is negative,  PSYCH:  Pelvic Exam: Cervical exam: Dilation: Fingertip Exam by:: Dr. Alanda Slim Presentation: cephalic Uterine activityDate/time of onset: 10/18/2015  Fetal monitoringBaseline: 130 bpm, Variability: Good {> 6 bpm), Accelerations: Reactive and Decelerations: Absent  Prenatal labs: ABO, Rh: O/Positive/-- (02/20 0000) Antibody: Negative (02/20 0000) Rubella: !Error! RPR: Nonreactive (02/20 0000)  HBsAg: Negative (02/20 0000)  HIV: Non-reactive (02/20 0000)  GBS:    1 hr Glucola 149. 3 hr glucola negative Genetic screening  negative Anatomy US normal at 18 weeks  Prenatal Transfer Tool  Maternal Diabetes: No. But gestational diabetes prior pregnancy Genetic Screening: Normal Maternal Ultrasounds/Referrals: Normal Fetal Ultrasounds or other Referrals:  Fetal echo (father with congential heart disease) Maternal Substance Abuse:  No Significant Maternal Medications:  Meds include: Other: Lovenox for DVT Significant Maternal Lab Results: None  Results for orders placed or performed during the hospital encounter of 10/20/15 (from the past 24 hour(s))  Urinalysis, Routine w reflex microscopic (not at [facility])   Collection Time: 10/20/15  5:55 AM  Result Value Ref Range   Color, Urine YELLOW YELLOW   APPearance CLEAR CLEAR   Specific Gravity, Urine 1.010 1.005 - 1.030   pH 7.5 5.0 - 8.0   Glucose, UA NEGATIVE NEGATIVE mg/dL   Hgb urine dipstick SMALL (A) NEGATIVE   Bilirubin Urine NEGATIVE NEGATIVE   Ketones, ur >80 (A) NEGATIVE  mg/dL   Protein, ur NEGATIVE NEGATIVE mg/dL   Nitrite NEGATIVE NEGATIVE   Leukocytes, UA NEGATIVE NEGATIVE  Urine microscopic-add on   Collection Time: 10/20/15  5:55 AM  Result Value Ref Range   Squamous Epithelial / LPF 0-5 (A) NONE SEEN   WBC, UA 0-5 0 - 5 WBC/hpf   RBC / HPF 0-5 0 - 5 RBC/hpf   Bacteria, UA RARE (A) NONE SEEN   Urine-Other MUCOUS PRESENT   CBC   Collection Time: 10/20/15  6:30 AM  Result Value Ref Range   WBC 11.3 (  H) 4.0 - 10.5 K/uL   RBC 3.88 3.87 - 5.11 MIL/uL   Hemoglobin 11.7 (L) 12.0 - 15.0 g/dL   HCT 91.4 (L) 78.2 - 95.6 %   MCV 88.7 78.0 - 100.0 fL   MCH 30.2 26.0 - 34.0 pg   MCHC 34.0 30.0 - 36.0 g/dL   RDW 21.3 08.6 - 57.8 %   Platelets 297 150 - 400 K/uL  Results for orders placed or performed during the hospital encounter of 10/19/15 (from the past 24 hour(s))  Urinalysis, Routine w reflex microscopic (not at [facility])   Collection Time: 10/19/15  1:00 PM  Result Value Ref Range   Color, Urine YELLOW YELLOW   APPearance CLEAR CLEAR   Specific Gravity, Urine <1.005 (L) 1.005 - 1.030   pH 7.0 5.0 - 8.0   Glucose, UA NEGATIVE NEGATIVE mg/dL   Hgb urine dipstick MODERATE (A) NEGATIVE   Bilirubin Urine NEGATIVE NEGATIVE   Ketones, ur NEGATIVE NEGATIVE mg/dL   Protein, ur NEGATIVE NEGATIVE mg/dL   Nitrite NEGATIVE NEGATIVE   Leukocytes, UA SMALL (A) NEGATIVE  Urine microscopic-add on   Collection Time: 10/19/15  1:00 PM  Result Value Ref Range   Squamous Epithelial / LPF 0-5 (A) NONE SEEN   WBC, UA 0-5 0 - 5 WBC/hpf   RBC / HPF NONE SEEN 0 - 5 RBC/hpf   Bacteria, UA FEW (A) NONE SEEN    Patient Active Problem List   Diagnosis Date Noted  . Vaginal bleeding in pregnancy 10/20/2015  . Cholelithiasis affecting pregnancy in second trimester, antepartum 09/09/2015  . Liver masses 09/09/2015  . Maternal DVT (deep vein thrombosis), history of 07/23/2015  . Supervision of high-risk pregnancy 07/19/2015  . History of gestational diabetes mellitus  (GDM) 07/19/2015  . AMA (advanced maternal age) multigravida 35+ 07/19/2015  . Prior pregnancy complicated by DVT, antepartum 02/24/2011    Assessment: Maria Cook is a 40 y.o. [id number] at [redacted]w[redacted]d here with contraction and vaginal bleeding concerning for abruption. Limited US without abruption or previa. She continued to have vaginal bleeding and worsening abdominal pain after 1L of LR & procardia 10 mg x3. So will admit for further management.   Abruption/Contraction:  -IV line -s/p 1L of LR in MAU. Continue at 125 ml/hr -Type and cross -BMZ 12 mg q24hr x2 -Mg 4 gm once, then infusion  Pain: pain meds as needed   FWB: CAT-1  DVT: on Lovenox 40 mg daily. Last dose 10/18/2015 about 2200. Hold  Lovenox while actively bleeding  ID: HIV, GC/CT, wet prep, GBS  Maria Cook 10/20/2015, 9:43 AM

## 2015-10-20 NOTE — Progress Notes (Signed)
Dr. Alanda SlimGonfa notified that pt is still uncomfortable with contractions after having three doses of procardia.  Notified that the ultrasound report is back.

## 2015-10-20 NOTE — Lactation Note (Signed)
This note was copied from a baby's chart. Lactation Consultation Note  Initial visit made.  Baby was delivered at 31.2 weeks and 3 hours old in NICU.  In house Spanish interpreter present for visit.  I reviewed with mom the importance of initiating pumping to establish and maintain a milk supply.  Mom willing and anxious to begin.  S[employer] pump set up.  Instructed on use, frequency,  cleaning and milk storage.  Instructed on hand expression.  [place] [facility]  Patient Name: Maria [name] ZOXWR'UToday's Date: 10/20/2015 Reason for consult: Initial assessment;NICU baby   Maternal Data Has patient been taught Hand Expression?: Yes Does the patient have breastfeeding experience prior to this delivery?: Yes  Feeding    LATCH Score/Interventions                      Lactation Tools Discussed/Used WIC Program: Yes Pump Review: Setup, frequency, and cleaning;Milk Storage Initiated by:: LC Date initiated:: 10/20/15   Consult Status Consult Status: Follow-up Date: 10/21/15 Follow-up type: In-patient    Huston FoleyMOULDEN, Sydelle Sherfield S 10/20/2015, 2:05 PM

## 2015-10-21 LAB — RPR: RPR: NONREACTIVE

## 2015-10-21 LAB — HIV ANTIBODY (ROUTINE TESTING W REFLEX): HIV SCREEN 4TH GENERATION: NONREACTIVE

## 2015-10-21 MED ORDER — IBUPROFEN 600 MG PO TABS: 600.0000 mg | ORAL_TABLET | Freq: Four times a day (QID) | ORAL | Status: DC

## 2015-10-21 NOTE — Lactation Note (Signed)
This note was copied from a baby's chart. Lactation Consultation Note  Patient Name: Maria Cook ZOXWR'UToday's Date: 10/21/2015 Reason for consult: Follow-up assessment   With this mom of a NICU baby, now 9324 hours old, and 31 3/[redacted] weeks gestation. Mom is an experienced breast feeder, this is her fourth child, and she is expressing good amounts of colostrum. She will use a hand pump over night, and [facility]    Maternal Data    Feeding    LATCH Score/Interventions                      Lactation Tools Discussed/Used Pump Review: Setup, frequency, and cleaning   Consult Status Consult Status: PRN Follow-up type: In-patient (NICU)    Alfred LevinsLee, Jaqulyn Chancellor Anne 10/21/2015, 11:00 AM

## 2015-10-21 NOTE — Discharge Summary (Signed)
OB Discharge Summary  Patient Name: Maria Cook DOB: Oct 25, 1975 MRN: [id number]  Date of admission: 10/20/2015 Delivering MD: Shawna Clamp R   Date of discharge: 10/21/2015  Admitting diagnosis: CONTRACTIONS Intrauterine pregnancy: [redacted]w[redacted]d     Secondary diagnosis:Active Problems:   Vaginal bleeding in pregnancy  Additional problems:Hx of DVT     Discharge diagnosis: Preterm Pregnancy Delivered and Hx of DVT                                                                     Post partum procedures:none  Augmentation: none  Complications: None  Hospital course:  Onset of Labor With Vaginal Delivery     39 y.o. yo [id number] at [redacted]w[redacted]d was admitted in Active Labor on 10/20/2015. Patient had an uncomplicated labor course as follows:  Membrane Rupture Time/Date: 10:47 AM ,10/20/2015   Intrapartum Procedures: Episiotomy: None [1]                                         Lacerations:  None [1]  Patient had a delivery of a Viable infant. 10/20/2015  Information for the patient's newborn:  Amandalynn, Pitz [[id number]]  Delivery Method: Vaginal, Spontaneous Delivery (Filed from Delivery Summary)    Pateint had an uncomplicated postpartum course.  She is ambulating, tolerating a regular diet, passing flatus, and urinating well. Patient is discharged home in stable condition on 10/21/2015.    Physical exam  Filed Vitals:   10/20/15 1759 10/20/15 2200 10/21/15 0200 10/21/15 0559  BP: 98/62 104/70 119/79 108/79  Pulse: 80 76 60 72  Temp: 98.1 F (36.7 C) 98.5 F (36.9 C) 97.8 F (36.6 C) 98 F (36.7 C)  TempSrc: Oral Oral Oral Oral  Resp: Height:      Weight:      SpO2: 100% 97% 100% 99%   General: alert, cooperative and no distress Lochia: appropriate Uterine Fundus: firm Incision: N/A DVT Evaluation: No evidence of DVT seen on physical exam. Negative Homan's sign. No cords or calf tenderness. Labs: Lab Results  Component Value Date   WBC 11.3* 10/20/2015   HGB 11.7* 10/20/2015   HCT 34.4* 10/20/2015   MCV 88.7 10/20/2015   PLT 297 10/20/2015   CMP Latest Ref Rng 09/08/2015  Glucose 65 - 99 mg/dL 86  BUN 6 - 20 mg/dL 8  Creatinine 7.25 - 3.66 mg/dL 4.40(H)  Sodium 474 - 259 mmol/L 137  Potassium 3.5 - 5.1 mmol/L 3.7  Chloride 101 - 111 mmol/L 110  CO2 22 - 32 mmol/L 21(L)  Calcium 8.9 - 10.3 mg/dL 5.6(L)  Total Protein 6.5 - 8.1 g/dL 7.3  Total Bilirubin 0.3 - 1.2 mg/dL 0.4  Alkaline Phos 38 - 126 U/L 101  AST 15 - 41 U/L 20  ALT 14 - 54 U/L 22    Discharge instruction: per After Visit Summary and "Baby and Me Booklet".  After Visit Meds:    Medication List    ASK your doctor about these medications        enoxaparin 40 MG/0.4ML injection  Commonly known as:  LOVENOX  Inject 0.4 mLs (40 mg total) into the skin daily.     ferrous sulfate 325 (65 FE) MG tablet  Commonly known as:  FERROUSUL  Take 1 tablet (325 mg total) by mouth 2 (two) times daily.     HYDROcodone-acetaminophen 5-325 MG tablet  Commonly known as:  NORCO/VICODIN  Take 1-2 tablets by mouth every 4 (four) hours as needed for moderate pain or severe pain.     PRENATAL VITAMINS PLUS 27-1 MG Tabs  Take 1 tablet by mouth once.     promethazine 25 MG tablet  Commonly known as:  PHENERGAN  Take 1 tablet (25 mg total) by mouth every 4 (four) hours as needed for nausea or vomiting.        Diet: routine diet  Activity: Advance as tolerated. Pelvic rest for 6 weeks.   Outpatient follow up:2 weeks Follow up Appt:Future Appointments Date Time Provider Department Center  10/25/2015 9:45 AM [place] Bingharlie Pickens, MD [facility]  11/08/2015 8:00 AM [facility] US 3 [facility]   Follow up visit: No Follow-up on file.  Postpartum contraception: plans BTL  Newborn Data: Live born female  Birth Weight: 3 lb 9.5 oz (1630 g) APGAR: 5, 7  Baby Feeding: Bottle and Breast Disposition:NICU   10/21/2015 Wyvonnia DuskyMarie Judith Demps, CNM

## 2015-10-21 NOTE — Progress Notes (Signed)

## 2015-10-22 LAB — CULTURE, BETA STREP (GROUP B ONLY): SPECIAL REQUESTS: NORMAL

## 2015-10-22 LAB — GC/CHLAMYDIA PROBE AMP (~~LOC~~) NOT AT ARMC
CHLAMYDIA, DNA PROBE: NEGATIVE
NEISSERIA GONORRHEA: NEGATIVE

## 2015-10-22 NOTE — Progress Notes (Signed)
Checked on patients needs.  °Spanish Interpreter  °

## 2015-10-25 ENCOUNTER — Ambulatory Visit: Payer: Self-pay

## 2015-10-25 ENCOUNTER — Encounter: Payer: Medicaid Other | Admitting: Obstetrics and Gynecology

## 2015-10-25 NOTE — Lactation Note (Signed)
This note was copied from a baby's chart. Lactation Consultation Note  Met with mom at infant's bedside in NICU.  Mom states she is pumping and obtaining 20-30 mls of transitional milk.  Baby is 5 days old so this is a normal amount.  Patient Name: Maria Cook [id number] Date: 10/25/2015     Maternal Data    Feeding Feeding Type: Breast Milk Length of feed: 60 min  LATCH Score/Interventions                      Lactation Tools Discussed/Used     Consult Status      Ave Filter 10/25/2015, 11:15 AM

## 2015-11-08 ENCOUNTER — Inpatient Hospital Stay (HOSPITAL_COMMUNITY): Admission: RE | Admit: 2015-11-08 | Payer: Medicaid Other | Source: Ambulatory Visit

## 2015-11-19 ENCOUNTER — Encounter: Payer: Self-pay | Admitting: Obstetrics & Gynecology

## 2015-11-19 ENCOUNTER — Ambulatory Visit (INDEPENDENT_AMBULATORY_CARE_PROVIDER_SITE_OTHER): Payer: Self-pay | Admitting: Obstetrics & Gynecology

## 2015-11-19 NOTE — Patient Instructions (Signed)
Informacin sobre el dispositivo intrauterino (Intrauterine Device Information) Un dispositivo intrauterino (DIU) se inserta en el tero e impide el embarazo. Hay dos tipos de DIU:   DIU de cobre: este tipo de DIU est recubierto con un alambre de cobre y se inserta dentro del tero. El cobre hace que el tero y las trompas de Falopio produzcan un liquido que destruye los espermatozoides. El DIU de cobre puede permanecer en el lugar durante 10 aos.  DIU con hormona: este tipo de DIU contiene la hormona progestina (progesterona sinttica). Las hormonas hacen que el moco cervical se haga ms espeso, lo que evita que el esperma ingrese al tero. Tambin hace que la membrana que recubre internamente al tero sea ms delgada lo que impide el implante del vulo fertilizado. La hormona debilita o destruye los espermatozoides que ingresan al tero. Alguno de los tipos de DIU hormonal pueden permanecer en el lugar durante 5 aos y otros tipos pueden dejarse en el lugar por 3 aos. El mdico se asegurar de que usted sea una buena candidata para usar el DIU. Converse con su mdico acerca de los posibles efectos secundarios.  VENTAJAS DEL DISPOSITIVO INTRAUTERINO  El DIU es muy eficaz, reversible, de accin prolongada y de bajo mantenimiento.  No hay efectos secundarios relacionados con el estrgeno.  El DIU puede ser utilizado durante la lactancia.  No est asociado con el aumento de peso.  Funciona inmediatamente despus de la insercin.  El DIU hormonal funciona inmediatamente si se inserta dentro de los 7 das del inicio del perodo. Ser necesario que utilice un mtodo anticonceptivo adicional durante 7 das si el DIU hormonal se inserta en algn otro momento del ciclo.  El DIU de cobre no interfiere con las hormonas femeninas.  El DIU hormonal puede hacer que los perodos menstruales abundantes se hagan ms ligeros y que haya menos clicos.  El DIU hormonal puede usarse durante 3 a 5  aos.  El DIU de cobre puede usarse durante 10 aos. DESVENTAJAS DEL DISPOSITIVO INTRAUTERINO  El DIU hormonal puede estar asociado con patrones de sangrado irregular.  El DIU de cobre puede hacer que el flujo menstrual ms abundante y doloroso.  Puede experimentar clicos y sangrado vaginal despus de la insercin.   Esta informacin no tiene como fin reemplazar el consejo del mdico. Asegrese de hacerle al mdico cualquier pregunta que tenga.   Document Released: 10/16/2009 Document Revised: 12/29/2012 Elsevier Interactive Patient Education 2016 Elsevier Inc.  

## 2015-11-19 NOTE — Progress Notes (Signed)
Subjective:     Maria Cook is a 40 y.o. female who presents for a postpartum visit. She is 4 weeks postpartum following a spontaneous vaginal delivery. I have fully reviewed the prenatal and intrapartum course. The delivery was at 31 gestational weeks. Outcome: spontaneous vaginal delivery. Anesthesia: none. Postpartum course has been unremarkable. Baby's course has been complicated by a NICU stay, but has since been discharged home. Baby is feeding by both breast and bottle - Similac Neosure. Bleeding no bleeding. Bowel function is normal. Bladder function is normal. Patient is not sexually active. Contraception method is none. Postpartum depression screening: negative.  Spanish interpreter used  The following portions of the patient's history were reviewed and updated as appropriate: allergies, current medications, past family history, past medical history, past social history, past surgical history and problem list.  Review of Systems Pertinent items are noted in HPI.   Objective:    Breastfeeding? Yes  General:  alert, cooperative and no distress           Abdomen: soft, non-tender; bowel sounds normal; no masses,  no organomegaly   Vulva:  not evaluated  Vagina: not evaluated   Assessment:     normal postpartum exam. Pap smear not done at today's visit.   Plan:    1. Contraception: considering options 2. Info on IUd  3. Follow up as needed. [facility] management of her risk of DVT recurrence  Adam PhenixJames G Arnold, MD

## 2016-07-14 ENCOUNTER — Emergency Department (HOSPITAL_COMMUNITY)
Admission: EM | Admit: 2016-07-14 | Discharge: 2016-07-15 | Disposition: A | Discharge status: Home / Self Care | Payer: Self-pay | Attending: Emergency Medicine | Admitting: Emergency Medicine

## 2016-07-14 ENCOUNTER — Encounter (HOSPITAL_COMMUNITY): Payer: Self-pay | Admitting: Emergency Medicine

## 2016-07-14 DIAGNOSIS — K8050 Calculus of bile duct without cholangitis or cholecystitis without obstruction: Secondary | ICD-10-CM | POA: Insufficient documentation

## 2016-07-14 DIAGNOSIS — R1011 Right upper quadrant pain: Secondary | ICD-10-CM

## 2016-07-14 LAB — CBC
HCT: 36.9 % (ref 36.0–46.0)
Hemoglobin: 12.3 g/dL (ref 12.0–15.0)
MCH: 30.1 pg (ref 26.0–34.0)
MCHC: 33.3 g/dL (ref 30.0–36.0)
MCV: 90.4 fL (ref 78.0–100.0)
PLATELETS: 228 10*3/uL (ref 150–400)
RBC: 4.08 MIL/uL (ref 3.87–5.11)
RDW: 13.8 % (ref 11.5–15.5)
WBC: 8 10*3/uL (ref 4.0–10.5)

## 2016-07-14 LAB — COMPREHENSIVE METABOLIC PANEL
ALT: 24 U/L (ref 14–54)
AST: 24 U/L (ref 15–41)
Albumin: 3.9 g/dL (ref 3.5–5.0)
Alkaline Phosphatase: 74 U/L (ref 38–126)
Anion gap: 7 (ref 5–15)
BILIRUBIN TOTAL: 0.5 mg/dL (ref 0.3–1.2)
BUN: 13 mg/dL (ref 6–20)
CALCIUM: 8.7 mg/dL — AB (ref 8.9–10.3)
CHLORIDE: 106 mmol/L (ref 101–111)
CO2: 23 mmol/L (ref 22–32)
CREATININE: 0.68 mg/dL (ref 0.44–1.00)
Glucose, Bld: 103 mg/dL — ABNORMAL HIGH (ref 65–99)
Potassium: 3.7 mmol/L (ref 3.5–5.1)
Sodium: 136 mmol/L (ref 135–145)
TOTAL PROTEIN: 7.8 g/dL (ref 6.5–8.1)

## 2016-07-14 LAB — URINALYSIS, ROUTINE W REFLEX MICROSCOPIC
Bilirubin Urine: NEGATIVE
Glucose, UA: NEGATIVE mg/dL
Hgb urine dipstick: NEGATIVE
KETONES UR: NEGATIVE mg/dL
LEUKOCYTES UA: NEGATIVE
NITRITE: NEGATIVE
PROTEIN: NEGATIVE mg/dL
Specific Gravity, Urine: 1.018 (ref 1.005–1.030)
pH: 7 (ref 5.0–8.0)

## 2016-07-14 LAB — POC URINE PREG, ED: PREG TEST UR: NEGATIVE

## 2016-07-14 LAB — LIPASE, BLOOD: Lipase: 32 U/L (ref 11–51)

## 2016-07-14 MED ORDER — ONDANSETRON HCL 4 MG/2ML IJ SOLN
4.0000 mg | Freq: Once | INTRAMUSCULAR | Status: AC
  Administered 2016-07-15: 4 mg via INTRAVENOUS
  Filled 2016-07-14: qty 2

## 2016-07-14 MED ORDER — MORPHINE SULFATE (PF) 4 MG/ML IV SOLN
4.0000 mg | Freq: Once | INTRAVENOUS | Status: AC
  Administered 2016-07-15: 4 mg via INTRAVENOUS
  Filled 2016-07-14: qty 1

## 2016-07-14 NOTE — ED Triage Notes (Signed)
Pt arrives via POv from home with upper abdominal pain that began today at 5pm. Pt reports hx of gallstones. Abdomen appears distended. Pt reports nausea and vomiting. Denies fever, diarrhea. Vss.

## 2016-07-14 NOTE — ED Provider Notes (Signed)
[facility]-EMERGENCY DEPT Provider Note   CSN: [id number] Arrival date & time: 07/14/16  2200  By signing my name below, I, Marnette Burgess Long, attest that this documentation has been prepared under the direction and in the presence of Shon Baton, MD . Electronically Signed: Marnette Burgess Long, Scribe. 07/14/2016. 11:33 PM.   History   Chief Complaint Chief Complaint  Patient presents with  . Abdominal Pain    The history is provided by the patient and a friend. A language interpreter was used.    HPI Comments:  Maria Cook is a 41 y.o. female with a h/o maternal DVT, who presents to the Emergency Department complaining of moderate, RUQ abdominal pain onset this evening around 5:00 PM. She notes the pain was brought on and made worse after eating. She has associated symptoms of nausea and vomiting, She reports trying Advil at home with no relief of her symptoms. She is not currently on an anticoagulant or antiplatelet but does have a h/o of maternal DVT with Lovenox taken. She states she is having normal bowel movements at this time. Per pt, she does not have a PSHx of cholecystectomy. Pt denies diarrhea, fever, constipation, and any other complaints at this time.   Past Medical History:  Diagnosis Date  . Gestational diabetes 02/24/2011   in pregnancies 2008 and 2012  . Prior pregnancy complicated by DVT, antepartum 02/24/2011   2008 and 2012 on lovenox    Patient Active Problem List   Diagnosis Date Noted  . Liver masses 09/09/2015  . Maternal DVT (deep vein thrombosis), history of 07/23/2015  . History of gestational diabetes mellitus (GDM) 07/19/2015    History reviewed. No pertinent surgical history.  OB History    Gravida Para Term Preterm AB Living   7 7 6 1  0 7   SAB TAB Ectopic Multiple Live Births   0 0 0 0 7       Home Medications    Prior to Admission medications   Medication Sig Start Date End Date Taking? Authorizing Provider  ibuprofen  (ADVIL,MOTRIN) 200 MG tablet Take 400 mg by mouth every 6 (six) hours as needed for moderate pain.   Yes Historical Provider, MD  ondansetron (ZOFRAN ODT) 4 MG disintegrating tablet Take 1 tablet (4 mg total) by mouth every 8 (eight) hours as needed for nausea or vomiting. 07/15/16   Shon Baton, MD  oxyCODONE-acetaminophen (PERCOCET/ROXICET) 5-325 MG tablet Take 1-2 tablets by mouth every 6 (six) hours as needed for severe pain. 07/15/16   Shon Baton, MD    Family History Family History  Problem Relation Age of Onset  . Depression Father   . Diabetes Neg Hx   . Cancer Neg Hx   . Hypertension Neg Hx     Social History Social History  Substance Use Topics  . Smoking status: Never Smoker  . Smokeless tobacco: Never Used  . Alcohol use No     Allergies   Patient has no known allergies.   Review of Systems Review of Systems  Constitutional: Negative for fever.  Respiratory: Negative for shortness of breath.   Cardiovascular: Negative for chest pain.  Gastrointestinal: Positive for abdominal pain, nausea and vomiting. Negative for constipation and diarrhea.  Genitourinary: Negative for dysuria.  All other systems reviewed and are negative.    Physical Exam Updated Vital Signs BP 99/73 (BP Location: Right Arm)   Pulse 86   Temp 97.6 F (36.4 C) (Oral)   Resp 16  LMP 06/25/2016   SpO2 100%   Physical Exam  Constitutional: She is oriented to person, place, and time. She appears well-developed and well-nourished. No distress.  HENT:  Head: Normocephalic and atraumatic.  Cardiovascular: Normal rate, regular rhythm and normal heart sounds.   Pulmonary/Chest: Effort normal and breath sounds normal. No respiratory distress. She has no wheezes.  Abdominal: Soft. Bowel sounds are normal. She exhibits no mass. There is tenderness. There is no guarding.  Right upper quadrant tenderness to palpating, no rebound or guarding, positive Murphy's  Neurological: She is alert  and oriented to person, place, and time.  Skin: Skin is warm and dry.  Psychiatric: She has a normal mood and affect.  Nursing note and vitals reviewed.    ED Treatments / Results  DIAGNOSTIC STUDIES:  Oxygen Saturation is 100% on RA, normal by my interpretation.    COORDINATION OF CARE:  11:31 PM Discussed treatment plan with pt at bedside including US abdominal and pt agreed to plan.  Labs (all labs ordered are listed, but only abnormal results are displayed) Labs Reviewed  COMPREHENSIVE METABOLIC PANEL - Abnormal; Notable for the following:       Result Value   Glucose, Bld 103 (*)    Calcium 8.7 (*)    All other components within normal limits  URINALYSIS, ROUTINE W REFLEX MICROSCOPIC - Abnormal; Notable for the following:    APPearance HAZY (*)    All other components within normal limits  LIPASE, BLOOD  CBC  POC URINE PREG, ED    EKG  EKG Interpretation None       Radiology [place] Abdomen Limited Ruq  Result Date: 07/15/2016 CLINICAL DATA:  Right upper quadrant pain onset at 5 p.m. Nausea and vomiting. EXAM: US ABDOMEN LIMITED - RIGHT UPPER QUADRANT COMPARISON:  09/08/2015 abdominal ultrasound FINDINGS: Gallbladder: There is an approximately 2.5 cm calculus noted in the fundus of the gallbladder. Tiny calculi can minimal sludge also noted along the dependent aspect of the gallbladder. Gallbladder wall is normal in thickness at 3 mm. No sonographic Murphy's. Common bile duct: Diameter: 4 mm Liver: Focal somewhat ovoid area of echogenicity in the left hepatic lobe measuring 1.9 x 1.7 x 1.9 cm. This may represent a small hemangioma. IMPRESSION: 1. Uncomplicated cholelithiasis the largest is approximately 2.5 cm noted in the fundus. 2. An ovoid echogenic left hepatic lobe lesion consistent with a hemangioma is currently identified measuring 1.9 x 1.7 x 1.9 cm. Electronically Signed   By: Tollie Ethavid  Kwon M.D.   On: 07/15/2016 00:57    Procedures Procedures (including critical  care time)  Medications Ordered in ED Medications  ondansetron (ZOFRAN) injection 4 mg (4 mg Intravenous Given 07/15/16 0000)  morphine 4 MG/ML injection 4 mg (4 mg Intravenous Given 07/15/16 0000)  oxyCODONE-acetaminophen (PERCOCET/ROXICET) 5-325 MG per tablet 1 tablet (1 tablet Oral Given 07/15/16 0116)     Initial Impression / Assessment and Plan / ED Course  I have reviewed the triage vital signs and the nursing notes.  Pertinent labs & imaging results that were available during my care of the patient were reviewed by me and considered in my medical decision making (see chart for details).  Clinical Course as of Jul 15 201  Tue Jul 15, 2016  0150 able to tolerate fluids. Reports improvement of pain. Will have patient follow-up with general surgery as an outpatient.  [CH]    Clinical Course User Index [CH] Shon Batonourtney F Loy Little, MD    Patient presents with right  upper quadrant pain. History and physical exam is consistent with biliary colic versus cholecystitis. She is otherwise nontoxic. Vital signs are reassuring. Patient was given fluids and pain medication. Prep quadrant ultrasound shows uncomplicated cholelithiasis. On recheck, patient states her pain has improved. She was given a by mouth challenge and was able to tolerate fluids. Discussed with patient and her family follow-up with general surgery. Patient was given strict return precautions.  After history, exam, and medical workup I feel the patient has been appropriately medically screened and is safe for discharge home. Pertinent diagnoses were discussed with the patient. Patient was given return precautions.   Final Clinical Impressions(s) / ED Diagnoses   Final diagnoses:  RUQ pain  Biliary colic    New Prescriptions New Prescriptions   ONDANSETRON (ZOFRAN ODT) 4 MG DISINTEGRATING TABLET    Take 1 tablet (4 mg total) by mouth every 8 (eight) hours as needed for nausea or vomiting.   OXYCODONE-ACETAMINOPHEN  (PERCOCET/ROXICET) 5-325 MG TABLET    Take 1-2 tablets by mouth every 6 (six) hours as needed for severe pain.   I personally performed the services described in this documentation, which was scribed in my presence. The recorded information has been reviewed and is accurate.     Shon Baton, MD 07/15/16 [phone]

## 2016-07-15 ENCOUNTER — Emergency Department (HOSPITAL_COMMUNITY): Payer: Self-pay

## 2016-07-15 MED ORDER — OXYCODONE-ACETAMINOPHEN 5-325 MG PO TABS: 1.0000 | ORAL_TABLET | Freq: Four times a day (QID) | ORAL | 0 refills | Status: DC | PRN

## 2016-07-15 MED ORDER — OXYCODONE-ACETAMINOPHEN 5-325 MG PO TABS
1.0000 | ORAL_TABLET | Freq: Once | ORAL | Status: AC
Start: 2016-07-15 — End: 2016-07-15
  Administered 2016-07-15: 1 via ORAL
  Filled 2016-07-15: qty 1

## 2016-07-15 MED ORDER — ONDANSETRON 4 MG PO TBDP: 4.0000 mg | ORAL_TABLET | Freq: Three times a day (TID) | ORAL | 0 refills | Status: DC | PRN

## 2016-07-15 NOTE — Discharge Instructions (Signed)
Follow-up with general surgery. If you have worsening symptoms, you should return for further evaluation.

## 2016-07-15 NOTE — ED Notes (Signed)
Patient transported to Ultrasound 

## 2016-09-16 ENCOUNTER — Encounter (HOSPITAL_COMMUNITY): Payer: Self-pay | Admitting: Emergency Medicine

## 2019-03-07 ENCOUNTER — Emergency Department (HOSPITAL_COMMUNITY)
Admission: EM | Admit: 2019-03-07 | Discharge: 2019-03-07 | Disposition: A | Discharge status: Home / Self Care | Payer: Self-pay | Attending: Emergency Medicine | Admitting: Emergency Medicine

## 2019-03-07 ENCOUNTER — Encounter (HOSPITAL_COMMUNITY): Payer: Self-pay | Admitting: Emergency Medicine

## 2019-03-07 ENCOUNTER — Other Ambulatory Visit: Payer: Self-pay

## 2019-03-07 DIAGNOSIS — X102XXS Contact with fats and cooking oils, sequela: Secondary | ICD-10-CM | POA: Insufficient documentation

## 2019-03-07 DIAGNOSIS — L910 Hypertrophic scar: Secondary | ICD-10-CM | POA: Insufficient documentation

## 2019-03-07 DIAGNOSIS — T2000XS Burn of unspecified degree of head, face, and neck, unspecified site, sequela: Secondary | ICD-10-CM | POA: Insufficient documentation

## 2019-03-07 DIAGNOSIS — T300 Burn of unspecified body region, unspecified degree: Secondary | ICD-10-CM

## 2019-03-07 MED ORDER — CEPHALEXIN 500 MG PO CAPS: 500.0000 mg | ORAL_CAPSULE | Freq: Three times a day (TID) | ORAL | 0 refills | Status: AC
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
## 2019-03-07 NOTE — Discharge Instructions (Addendum)
You were evaluated in the Emergency Department and after careful evaluation, we did not find any emergent condition requiring admission or further testing in the hospital.  Please take the antibiotic as directed to treat for possible skin infection of the scar.  We recommend follow-up with the [facility].  They are located on [address] in [place], [place].  Please call [phone] to schedule an appointment.  Please return to the Emergency Department if you experience any worsening of your condition.  We encourage you to follow up with a primary care provider.  Thank you for allowing [place] to be a part of your care.

## 2019-07-19 ENCOUNTER — Ambulatory Visit: Payer: Self-pay | Admitting: Internal Medicine

## 2019-12-06 ENCOUNTER — Ambulatory Visit: Payer: Self-pay | Admitting: Internal Medicine

## 2020-04-11 ENCOUNTER — Institutional Professional Consult (permissible substitution): Payer: Self-pay | Admitting: Plastic Surgery

## 2020-04-25 ENCOUNTER — Other Ambulatory Visit: Payer: Self-pay

## 2020-04-25 ENCOUNTER — Encounter: Payer: Self-pay | Admitting: Plastic Surgery

## 2020-04-25 ENCOUNTER — Ambulatory Visit (INDEPENDENT_AMBULATORY_CARE_PROVIDER_SITE_OTHER): Payer: Self-pay | Admitting: Plastic Surgery

## 2020-04-25 VITALS — BP 126/80 | HR 75 | Temp 98.2°F | Ht 62.0 in | Wt 151.8 lb

## 2020-04-25 DIAGNOSIS — L910 Hypertrophic scar: Secondary | ICD-10-CM
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
## 2020-05-24 ENCOUNTER — Encounter: Payer: Self-pay | Admitting: Plastic Surgery

## 2020-05-24 ENCOUNTER — Other Ambulatory Visit: Payer: Self-pay

## 2020-05-24 ENCOUNTER — Ambulatory Visit (INDEPENDENT_AMBULATORY_CARE_PROVIDER_SITE_OTHER): Payer: Self-pay | Admitting: Plastic Surgery

## 2020-05-24 VITALS — BP 135/86 | HR 72

## 2020-05-24 DIAGNOSIS — L910 Hypertrophic scar: Secondary | ICD-10-CM

## 2020-05-24 MED ORDER — HYDROCODONE-ACETAMINOPHEN 5-325 MG PO TABS: 1.0000 | ORAL_TABLET | Freq: Four times a day (QID) | ORAL | 0 refills | Status: DC | PRN

## 2020-05-24 NOTE — Progress Notes (Signed)
Operative Note   DATE OF OPERATION: 05/24/2020  LOCATION:    SURGICAL DEPARTMENT: Plastic Surgery  PREOPERATIVE DIAGNOSES: Right cheek scar  POSTOPERATIVE DIAGNOSES:  same  PROCEDURE:  1. Excision of right cheek scar measuring 20 x 3 cm 2. Complex closure measuring 22 cm  SURGEON: Ancil Linsey, MD  ANESTHESIA:  Local  COMPLICATIONS: None.   INDICATIONS FOR PROCEDURE:  The patient, Maria Cook is a 45 y.o. female born on Nov 19, 1975, is here for treatment of right cheek scar MRN: [id number]  CONSENT:  Informed consent was obtained directly from the patient. Risks, benefits and alternatives were fully discussed. Specific risks including but not limited to bleeding, infection, hematoma, seroma, scarring, pain, infection, wound healing problems, and need for further surgery were all discussed. The patient did have an ample opportunity to have questions answered to satisfaction.   DESCRIPTION OF PROCEDURE:  Local anesthesia was administered. The patient's operative site was prepped and draped in a sterile fashion. A time out was performed and all information was confirmed to be correct.  The lesion was excised with a 15 blade.  Hemostasis was obtained.  Circumferential undermining was performed and the skin was advanced and closed in layers with interrupted buried Monocryl sutures and 5-0 fast gut for the skin.  The lesion excised measured 20 cm, and the total length of closure measured 22 cm.    The patient tolerated the procedure well.  There were no complications.

## 2020-05-31 ENCOUNTER — Ambulatory Visit: Payer: Self-pay | Attending: Internal Medicine

## 2020-05-31 ENCOUNTER — Ambulatory Visit (INDEPENDENT_AMBULATORY_CARE_PROVIDER_SITE_OTHER): Payer: Self-pay | Admitting: Plastic Surgery

## 2020-05-31 ENCOUNTER — Other Ambulatory Visit: Payer: Self-pay

## 2020-05-31 ENCOUNTER — Encounter: Payer: Self-pay | Admitting: Plastic Surgery

## 2020-05-31 VITALS — BP 132/93 | HR 77

## 2020-05-31 DIAGNOSIS — L910 Hypertrophic scar: Secondary | ICD-10-CM

## 2020-05-31 DIAGNOSIS — Z23 Encounter for immunization: Secondary | ICD-10-CM

## 2020-05-31 NOTE — Progress Notes (Signed)
Patient is here 1 week postop from excision of a large right cheek scar.  She is overall very happy and is doing well.  On exam most of her absorbable sutures have fallen out at this point.  The scar looks to be healing fine with no evidence of widening at the moment.  She has a smooth contour and normal facial nerve function.  This point I gave her some information on scar management.  I have advised her to protect it from the sun and she can use scar creams if she would like.  I Maria Cook set a follow-up appointment for 3 months in the event that she would like to have me check this again in the future.  Otherwise we will see her again as needed.

## 2020-05-31 NOTE — Progress Notes (Signed)
   Covid-19 Vaccination Clinic  Name:  Shakirra Buehler    MRN: [id number] DOB: July 02, 1975  05/31/2020  Ms. Rockney Ghee was observed post Covid-19 immunization for 15 minutes without incident. She was provided with Vaccine Information Sheet and instruction to access the V-Safe system.   Ms. Rockney Ghee was instructed to call 911 with any severe reactions post vaccine: Marland Kitchen Difficulty breathing  . Swelling of face and throat  . A fast heartbeat  . A bad rash all over body  . Dizziness and weakness   Immunizations Administered    Name Date Dose VIS Date Route   Pfizer COVID-19 Vaccine 05/31/2020  4:37 PM 0.3 mL 02/29/2020 Intramuscular   Manufacturer: [employer], [employer]   Lot: [id number]   NDC: [address]-4

## 2020-06-26 ENCOUNTER — Emergency Department (HOSPITAL_COMMUNITY)
Admission: EM | Admit: 2020-06-26 | Discharge: 2020-06-27 | Disposition: A | Discharge status: Home / Self Care | Payer: No Typology Code available for payment source | Attending: Emergency Medicine | Admitting: Emergency Medicine

## 2020-06-26 DIAGNOSIS — M25521 Pain in right elbow: Secondary | ICD-10-CM | POA: Insufficient documentation

## 2020-06-26 DIAGNOSIS — Y92410 Unspecified street and highway as the place of occurrence of the external cause: Secondary | ICD-10-CM | POA: Insufficient documentation

## 2020-06-26 DIAGNOSIS — M545 Low back pain, unspecified: Secondary | ICD-10-CM | POA: Insufficient documentation

## 2020-06-26 DIAGNOSIS — M25522 Pain in left elbow: Secondary | ICD-10-CM | POA: Diagnosis not present

## 2020-06-26 DIAGNOSIS — M542 Cervicalgia: Secondary | ICD-10-CM | POA: Insufficient documentation
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
## 2020-06-27 ENCOUNTER — Emergency Department (HOSPITAL_COMMUNITY): Payer: No Typology Code available for payment source

## 2020-06-27 MED ORDER — IBUPROFEN 200 MG PO TABS
600.0000 mg | ORAL_TABLET | Freq: Once | ORAL | Status: AC
  Administered 2020-06-27: 600 mg via ORAL
  Filled 2020-06-27: qty 1

## 2020-06-27 MED ORDER — METHOCARBAMOL 500 MG PO TABS: 500.0000 mg | ORAL_TABLET | Freq: Two times a day (BID) | ORAL | 0 refills | Status: DC

## 2020-06-27 MED ORDER — IBUPROFEN 600 MG PO TABS: 600.0000 mg | ORAL_TABLET | Freq: Four times a day (QID) | ORAL | 0 refills | Status: DC | PRN

## 2020-06-27 NOTE — ED Notes (Signed)
E-signature pad unavailable at time of pt discharge. This RN discussed discharge materials with pt and answered all pt questions. Pt stated understanding of discharge material. ? ?

## 2020-06-27 NOTE — Discharge Instructions (Signed)
The pain you are experiencing is likely due to muscle strain, you may take Ibuprofen and Robaxin as needed for pain management. Do not combine with any pain reliever other than tylenol.  You may also use ice and heat, and over-the-counter remedies such as Biofreeze gel or salon pas lidocaine patches. The muscle soreness should improve over the next week. Follow up with your family doctor in the next week for a recheck if you are still having symptoms. Return to ED if pain is worsening, you develop weakness or numbness of extremities, or new or concerning symptoms develop.  ------------------  Es probable que el dolor que est experimentando se deba a una distensin muscular; puede tomar ibuprofeno y Robaxin segn sea necesario para Human resources officer. No lo combine con ningn analgsico que no sea Tylenol. Tambin puede usar hielo y Airline pilot, y remedios de venta libre como el gel Biofreeze o los parches de lidocana de [place] pas. El dolor muscular debera mejorar durante la prxima semana. Haga un seguimiento con su mdico de familia en la prxima semana para volver a controlar si todava tiene sntomas. Regrese a la sala de urgencias si el dolor empeora, si desarrolla debilidad o entumecimiento de las extremidades, o si se desarrollan sntomas nuevos o preocupantes.
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
## 2020-09-05 ENCOUNTER — Ambulatory Visit: Payer: Self-pay | Admitting: Plastic Surgery

## 2021-01-22 ENCOUNTER — Encounter (HOSPITAL_COMMUNITY): Payer: Self-pay

## 2021-01-22 ENCOUNTER — Emergency Department (HOSPITAL_COMMUNITY): Payer: No Typology Code available for payment source

## 2021-01-22 ENCOUNTER — Other Ambulatory Visit: Payer: Self-pay

## 2021-01-22 ENCOUNTER — Emergency Department (HOSPITAL_COMMUNITY)
Admission: EM | Admit: 2021-01-22 | Discharge: 2021-01-22 | Disposition: A | Discharge status: Home / Self Care | Payer: No Typology Code available for payment source | Attending: Emergency Medicine | Admitting: Emergency Medicine

## 2021-01-22 DIAGNOSIS — M25512 Pain in left shoulder: Secondary | ICD-10-CM | POA: Insufficient documentation

## 2021-01-22 DIAGNOSIS — M542 Cervicalgia: Secondary | ICD-10-CM | POA: Diagnosis not present

## 2021-01-22 DIAGNOSIS — R079 Chest pain, unspecified: Secondary | ICD-10-CM | POA: Insufficient documentation

## 2021-01-22 DIAGNOSIS — M7918 Myalgia, other site: Secondary | ICD-10-CM

## 2021-01-22 MED ORDER — IBUPROFEN 600 MG PO TABS: 600.0000 mg | ORAL_TABLET | Freq: Four times a day (QID) | ORAL | 0 refills | Status: AC | PRN

## 2021-01-22 MED ORDER — IBUPROFEN 200 MG PO TABS
600.0000 mg | ORAL_TABLET | Freq: Once | ORAL | Status: AC
  Administered 2021-01-22: 600 mg via ORAL
  Filled 2021-01-22: qty 3

## 2021-01-22 MED ORDER — METHOCARBAMOL 500 MG PO TABS: 500.0000 mg | ORAL_TABLET | Freq: Two times a day (BID) | ORAL | 0 refills | Status: AC

## 2021-01-22 NOTE — Discharge Instructions (Addendum)
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
## 2021-03-05 ENCOUNTER — Emergency Department (HOSPITAL_COMMUNITY)
Admission: EM | Admit: 2021-03-05 | Discharge: 2021-03-06 | Disposition: A | Discharge status: Home / Self Care | Payer: No Typology Code available for payment source | Attending: Student | Admitting: Student

## 2021-03-05 ENCOUNTER — Other Ambulatory Visit: Payer: Self-pay

## 2021-03-05 ENCOUNTER — Emergency Department (HOSPITAL_COMMUNITY): Payer: No Typology Code available for payment source

## 2021-03-05 DIAGNOSIS — Z5321 Procedure and treatment not carried out due to patient leaving prior to being seen by health care provider: Secondary | ICD-10-CM | POA: Insufficient documentation

## 2021-03-05 DIAGNOSIS — M542 Cervicalgia: Secondary | ICD-10-CM | POA: Diagnosis present

## 2021-03-05 DIAGNOSIS — Y92410 Unspecified street and highway as the place of occurrence of the external cause: Secondary | ICD-10-CM | POA: Insufficient documentation

## 2021-03-05 DIAGNOSIS — M549 Dorsalgia, unspecified: Secondary | ICD-10-CM | POA: Diagnosis not present

## 2021-03-05 MED ORDER — LIDOCAINE 5 % EX PTCH: 2.0000 | MEDICATED_PATCH | CUTANEOUS | Status: DC
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
## 2021-03-06 NOTE — ED Notes (Signed)
Patient called x3 with no response °

## 2021-03-14 ENCOUNTER — Other Ambulatory Visit: Payer: Self-pay

## 2021-03-14 DIAGNOSIS — N6320 Unspecified lump in the left breast, unspecified quadrant: Secondary | ICD-10-CM

## 2021-04-09 ENCOUNTER — Other Ambulatory Visit: Payer: Self-pay

## 2021-04-09 ENCOUNTER — Ambulatory Visit
Admission: RE | Admit: 2021-04-09 | Discharge: 2021-04-09 | Disposition: A | Discharge status: Home / Self Care | Payer: Self-pay | Source: Ambulatory Visit | Attending: Obstetrics and Gynecology | Admitting: Obstetrics and Gynecology

## 2021-04-09 ENCOUNTER — Ambulatory Visit
Admission: RE | Admit: 2021-04-09 | Discharge: 2021-04-09 | Disposition: A | Discharge status: Home / Self Care | Payer: No Typology Code available for payment source | Source: Ambulatory Visit | Attending: Obstetrics and Gynecology | Admitting: Obstetrics and Gynecology

## 2021-04-09 ENCOUNTER — Ambulatory Visit: Payer: Self-pay | Admitting: *Deleted

## 2021-04-09 ENCOUNTER — Other Ambulatory Visit: Payer: Self-pay | Admitting: Obstetrics and Gynecology

## 2021-04-09 VITALS — BP 122/70 | Wt 141.5 lb

## 2021-04-09 DIAGNOSIS — Z1239 Encounter for other screening for malignant neoplasm of breast: Secondary | ICD-10-CM

## 2021-04-09 DIAGNOSIS — N6320 Unspecified lump in the left breast, unspecified quadrant: Secondary | ICD-10-CM

## 2021-04-09 DIAGNOSIS — Z1211 Encounter for screening for malignant neoplasm of colon: Secondary | ICD-10-CM

## 2021-04-09 DIAGNOSIS — M79622 Pain in left upper arm: Secondary | ICD-10-CM
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
## 2021-04-09 NOTE — Patient Instructions (Signed)
Explained breast self awareness with Aurther Loft. Patient did not need a Pap smear today due to last Pap smear was 02/13/2021. Let her know BCCCP will cover Pap smears every 3 years unless has a history of abnormal Pap smears. Referred patient to the [facility] for a diagnostic mammogram. Appointment scheduled Tuesday, April 09, 2021 at 1010. Patient aware of appointment and will be there. Drue Dun Alfaro verbalized understanding.  Brittley Regner, Kathaleen Maser, RN 8:39 AM

## 2021-04-12 ENCOUNTER — Ambulatory Visit
Admission: RE | Admit: 2021-04-12 | Discharge: 2021-04-12 | Disposition: A | Discharge status: Home / Self Care | Payer: No Typology Code available for payment source | Source: Ambulatory Visit | Attending: Obstetrics and Gynecology | Admitting: Obstetrics and Gynecology

## 2021-04-12 DIAGNOSIS — N6320 Unspecified lump in the left breast, unspecified quadrant: Secondary | ICD-10-CM

## 2021-04-16 LAB — SURGICAL PATHOLOGY
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
## 2022-10-09 ENCOUNTER — Emergency Department (HOSPITAL_COMMUNITY)
Admission: EM | Admit: 2022-10-09 | Discharge: 2022-10-10 | Disposition: A | Discharge status: Home / Self Care | Payer: Self-pay | Attending: Emergency Medicine | Admitting: Emergency Medicine

## 2022-10-09 ENCOUNTER — Encounter (HOSPITAL_COMMUNITY): Payer: Self-pay

## 2022-10-09 ENCOUNTER — Emergency Department (HOSPITAL_COMMUNITY): Payer: Self-pay

## 2022-10-09 ENCOUNTER — Other Ambulatory Visit: Payer: Self-pay

## 2022-10-09 DIAGNOSIS — K8020 Calculus of gallbladder without cholecystitis without obstruction: Secondary | ICD-10-CM | POA: Insufficient documentation

## 2022-10-09 MED ORDER — FAMOTIDINE IN NACL 20-0.9 MG/50ML-% IV SOLN
20.0000 mg | Freq: Once | INTRAVENOUS | Status: AC
  Administered 2022-10-10: 20 mg via INTRAVENOUS
  Filled 2022-10-09: qty 50

## 2022-10-09 MED ORDER — ALUM & MAG HYDROXIDE-SIMETH 200-200-20 MG/5ML PO SUSP
30.0000 mL | Freq: Once | ORAL | Status: AC
  Administered 2022-10-10: 30 mL via ORAL
  Filled 2022-10-09: qty 30

## 2022-10-09 NOTE — ED Provider Notes (Signed)
[facility]-EMERGENCY DEPT [facility] Emergency Department Provider Note MRN:  [id number]  Arrival date & time: 10/10/22     Chief Complaint   Abdominal Pain   History of Present Illness   Maria Cook is a 47 y.o. year-old female presents to the ED with chief complaint of epigastric pain.  Onset was this evening around 5 PM.  She took some omeprazole and had some improvement.  States that symptoms returned around 9 PM.  She has not had symptoms similar to this before.  She denies fever, chills, nausea, or vomiting.   History provided by patient.   Review of Systems  Pertinent positive and negative review of systems noted in HPI.    Physical Exam   Vitals:   10/09/22 2310 10/10/22 0213  BP: (!) 154/97 123/81  Pulse: 82 77  Resp: 18 18  Temp: 98.4 F (36.9 C)   SpO2: 100% 100%    CONSTITUTIONAL:  well-appearing, NAD NEURO:  Alert and oriented x 3, CN 3-12 grossly intact EYES:  eyes equal and reactive ENT/NECK:  Supple, no stridor  CARDIO:  normal rate, regular rhythm, appears well-perfused  PULM:  No respiratory distress, CTAB GI/GU:  non-distended, no focal tenderness MSK/SPINE:  No gross deformities, no edema, moves all extremities  SKIN:  no rash, atraumatic   *Additional and/or pertinent findings included in MDM below  Diagnostic and Interventional Summary    EKG Interpretation  Date/Time:  Thursday Oct 09 2022 23:14:17 EDT Ventricular Rate:  72 PR Interval:  130 QRS Duration: 97 QT Interval:  388 QTC Calculation: 425 R Axis:   22 Text Interpretation: Sinus rhythm No previous ECGs available Confirmed by Zadie Rhine ([id number]) on 10/10/2022 12:15:13 AM       Labs Reviewed  COMPREHENSIVE METABOLIC PANEL - Abnormal; Notable for the following components:      Result Value   Glucose, Bld 116 (*)    Calcium 8.4 (*)    AST 60 (*)    ALT 49 (*)    Alkaline Phosphatase 142 (*)    All other components within normal limits  CBC WITH  DIFFERENTIAL/PLATELET - Abnormal; Notable for the following components:   Abs Immature Granulocytes 0.08 (*)    All other components within normal limits  LIPASE, BLOOD  URINALYSIS, ROUTINE W REFLEX MICROSCOPIC  I-STAT BETA HCG BLOOD, ED ([facility], [facility], [facility] ONLY)  TROPONIN I (HIGH SENSITIVITY)    US Abdomen Limited RUQ (LIVER/GB)  Final Result    DG Abdomen Acute W/Chest  Final Result      Medications  famotidine (PEPCID) IVPB 20 mg premix (0 mg Intravenous Stopped 10/10/22 0044)  alum & mag hydroxide-simeth (MAALOX/MYLANTA) 200-200-20 MG/5ML suspension 30 mL (30 mLs Oral Given 10/10/22 0005)     Procedures  /  Critical Care Procedures  ED Course and Medical Decision Making  I have reviewed the triage vital signs, the nursing notes, and pertinent available records from the EMR.  Social Determinants Affecting Complexity of Care: Patient has no clinically significant social determinants affecting this chief complaint..   ED Course:    Medical Decision Making Patient here with epigastric abdominal pain.  Symptoms started tonight, but resolved after she took some omeprazole.  She states that the symptoms then returned around 9 PM.  She denies any vomiting.  Will give GI cocktail, Pepcid, check labs and reassess.  Patient has mildly elevated LFTs.  Will check right upper quadrant ultrasound.  Patient reassessed.  She no longer has any abdominal pain.  She complains of slight headache.  She asks how much longer she has to stay in the emergency department.  I discussed her results with her and have recommended close outpatient follow-up with general surgery.  She is agreeable with this plan.  Discussed strict return precautions.  Amount and/or Complexity of Data Reviewed Labs: ordered.    Details: Mildly elevated LFTs Radiology: ordered and independent interpretation performed.    Details: Gallstone present  Risk OTC drugs. Prescription drug management.     Consultants: No  consultations were needed in caring for this patient.   Treatment and Plan: I considered admission due to patient's initial presentation, but after considering the examination and diagnostic results, patient will not require admission and can be discharged with outpatient follow-up.  Patient discussed with attending physician, Dr. Bebe Shaggy, who recommends close outpatient followup and return if symptoms worsen.  Final Clinical Impressions(s) / ED Diagnoses     ICD-10-CM   1. Calculus of gallbladder without cholecystitis without obstruction  K80.20       ED Discharge Orders          Ordered    oxyCODONE-acetaminophen (PERCOCET) 5-325 MG tablet  Every 6 hours PRN,   Status:  Discontinued        10/10/22 0244    ondansetron (ZOFRAN-ODT) 4 MG disintegrating tablet  Every 8 hours PRN        10/10/22 0244    oxyCODONE-acetaminophen (PERCOCET) 5-325 MG tablet  Every 6 hours PRN        10/10/22 0244              Discharge Instructions Discussed with and Provided to Patient:     Discharge Instructions      Return for worsening pain, fever, or persistent vomiting.  Please follow-up with the surgeon.       Roxy Horseman, PA-C 10/10/22 1610    Zadie Rhine, MD 10/10/22 [id number]

## 2022-10-09 NOTE — ED Triage Notes (Signed)
Epigastric pain with intermittent chest pain that radiates into left side of back since 1700. Worsened after eating. Pt has had a few episodes of dizziness. Pt took omeprazole at home with a little bit of relief.

## 2022-10-10 ENCOUNTER — Emergency Department (HOSPITAL_COMMUNITY): Payer: Self-pay

## 2022-10-10 LAB — CBC WITH DIFFERENTIAL/PLATELET
Abs Immature Granulocytes: 0.08 10*3/uL — ABNORMAL HIGH (ref 0.00–0.07)
Basophils Absolute: 0 10*3/uL (ref 0.0–0.1)
Basophils Relative: 0 %
Eosinophils Absolute: 0.1 10*3/uL (ref 0.0–0.5)
Eosinophils Relative: 1 %
HCT: 37.8 % (ref 36.0–46.0)
Hemoglobin: 12.4 g/dL (ref 12.0–15.0)
Immature Granulocytes: 1 %
Lymphocytes Relative: 23 %
Lymphs Abs: 2 10*3/uL (ref 0.7–4.0)
MCH: 30.2 pg (ref 26.0–34.0)
MCHC: 32.8 g/dL (ref 30.0–36.0)
MCV: 92.2 fL (ref 80.0–100.0)
Monocytes Absolute: 0.4 10*3/uL (ref 0.1–1.0)
Monocytes Relative: 5 %
Neutro Abs: 5.9 10*3/uL (ref 1.7–7.7)
Neutrophils Relative %: 70 %
Platelets: 259 10*3/uL (ref 150–400)
RBC: 4.1 MIL/uL (ref 3.87–5.11)
RDW: 14.5 % (ref 11.5–15.5)
WBC: 8.5 10*3/uL (ref 4.0–10.5)
nRBC: 0 % (ref 0.0–0.2)

## 2022-10-10 LAB — URINALYSIS, ROUTINE W REFLEX MICROSCOPIC
Bacteria, UA: NONE SEEN
Bilirubin Urine: NEGATIVE
Glucose, UA: NEGATIVE mg/dL
Hgb urine dipstick: NEGATIVE
Ketones, ur: NEGATIVE mg/dL
Leukocytes,Ua: NEGATIVE
Nitrite: NEGATIVE
Protein, ur: NEGATIVE mg/dL
Specific Gravity, Urine: 1.014 (ref 1.005–1.030)
pH: 7 (ref 5.0–8.0)

## 2022-10-10 LAB — LIPASE, BLOOD: Lipase: 33 U/L (ref 11–51)

## 2022-10-10 LAB — COMPREHENSIVE METABOLIC PANEL
ALT: 49 U/L — ABNORMAL HIGH (ref 0–44)
AST: 60 U/L — ABNORMAL HIGH (ref 15–41)
Albumin: 3.6 g/dL (ref 3.5–5.0)
Alkaline Phosphatase: 142 U/L — ABNORMAL HIGH (ref 38–126)
Anion gap: 5 (ref 5–15)
BUN: 10 mg/dL (ref 6–20)
CO2: 24 mmol/L (ref 22–32)
Calcium: 8.4 mg/dL — ABNORMAL LOW (ref 8.9–10.3)
Chloride: 109 mmol/L (ref 98–111)
Creatinine, Ser: 0.57 mg/dL (ref 0.44–1.00)
GFR, Estimated: >60 mL/min (ref >60)
Glucose, Bld: 116 mg/dL — ABNORMAL HIGH (ref 70–99)
Potassium: 3.9 mmol/L (ref 3.5–5.1)
Sodium: 138 mmol/L (ref 135–145)
Total Bilirubin: 0.7 mg/dL (ref 0.3–1.2)
Total Protein: 7.7 g/dL (ref 6.5–8.1)

## 2022-10-10 LAB — I-STAT BETA HCG BLOOD, ED (MC, WL, AP ONLY): I-stat hCG, quantitative: <5 m[IU]/mL (ref <5)

## 2022-10-10 LAB — TROPONIN I (HIGH SENSITIVITY): Troponin I (High Sensitivity): 2 ng/L (ref <18)

## 2022-10-10 MED ORDER — OXYCODONE-ACETAMINOPHEN 5-325 MG PO TABS: 1.0000 | ORAL_TABLET | Freq: Four times a day (QID) | ORAL | 0 refills | Status: DC | PRN

## 2022-10-10 MED ORDER — OXYCODONE-ACETAMINOPHEN 5-325 MG PO TABS: 1.0000 | ORAL_TABLET | Freq: Four times a day (QID) | ORAL | 0 refills | Status: AC | PRN

## 2022-10-10 MED ORDER — ONDANSETRON 4 MG PO TBDP: 4.0000 mg | ORAL_TABLET | Freq: Three times a day (TID) | ORAL | 0 refills | Status: AC | PRN

## 2022-10-10 NOTE — Discharge Instructions (Signed)
Return for worsening pain, fever, or persistent vomiting.  Please follow-up with the surgeon.

## 2023-02-05 ENCOUNTER — Encounter (HOSPITAL_COMMUNITY): Payer: Self-pay

## 2023-02-05 ENCOUNTER — Emergency Department (HOSPITAL_COMMUNITY)
Admission: EM | Admit: 2023-02-05 | Discharge: 2023-02-05 | Disposition: A | Discharge status: Home / Self Care | Payer: Self-pay | Attending: Emergency Medicine | Admitting: Emergency Medicine

## 2023-02-05 ENCOUNTER — Other Ambulatory Visit: Payer: Self-pay

## 2023-02-05 ENCOUNTER — Emergency Department (HOSPITAL_COMMUNITY): Payer: Self-pay

## 2023-02-05 DIAGNOSIS — N939 Abnormal uterine and vaginal bleeding, unspecified: Secondary | ICD-10-CM | POA: Insufficient documentation

## 2023-02-05 DIAGNOSIS — N83209 Unspecified ovarian cyst, unspecified side: Secondary | ICD-10-CM

## 2023-02-05 LAB — URINALYSIS, ROUTINE W REFLEX MICROSCOPIC
Bacteria, UA: NONE SEEN
Bilirubin Urine: NEGATIVE
Glucose, UA: NEGATIVE mg/dL
Ketones, ur: NEGATIVE mg/dL
Leukocytes,Ua: NEGATIVE
Nitrite: NEGATIVE
Protein, ur: NEGATIVE mg/dL
RBC / HPF: >50 RBC/hpf (ref 0–5)
Specific Gravity, Urine: 1.014 (ref 1.005–1.030)
pH: 6 (ref 5.0–8.0)

## 2023-02-05 LAB — COMPREHENSIVE METABOLIC PANEL
ALT: 39 U/L (ref 0–44)
AST: 33 U/L (ref 15–41)
Albumin: 4 g/dL (ref 3.5–5.0)
Alkaline Phosphatase: 124 U/L (ref 38–126)
Anion gap: 7 (ref 5–15)
BUN: 12 mg/dL (ref 6–20)
CO2: 23 mmol/L (ref 22–32)
Calcium: 9 mg/dL (ref 8.9–10.3)
Chloride: 106 mmol/L (ref 98–111)
Creatinine, Ser: 0.59 mg/dL (ref 0.44–1.00)
GFR, Estimated: >60 mL/min (ref >60)
Glucose, Bld: 86 mg/dL (ref 70–99)
Potassium: 4 mmol/L (ref 3.5–5.1)
Sodium: 136 mmol/L (ref 135–145)
Total Bilirubin: 0.4 mg/dL (ref 0.3–1.2)
Total Protein: 8.6 g/dL — ABNORMAL HIGH (ref 6.5–8.1)

## 2023-02-05 LAB — CBC
HCT: 39.6 % (ref 36.0–46.0)
Hemoglobin: 12.8 g/dL (ref 12.0–15.0)
MCH: 30.1 pg (ref 26.0–34.0)
MCHC: 32.3 g/dL (ref 30.0–36.0)
MCV: 93.2 fL (ref 80.0–100.0)
Platelets: 313 10*3/uL (ref 150–400)
RBC: 4.25 MIL/uL (ref 3.87–5.11)
RDW: 14.3 % (ref 11.5–15.5)
WBC: 6.1 10*3/uL (ref 4.0–10.5)
nRBC: 0 % (ref 0.0–0.2)

## 2023-02-05 LAB — LIPASE, BLOOD: Lipase: 28 U/L (ref 11–51)

## 2023-02-05 LAB — HCG, SERUM, QUALITATIVE: Preg, Serum: NEGATIVE

## 2023-02-05 MED ORDER — KETOROLAC TROMETHAMINE 15 MG/ML IJ SOLN
15.0000 mg | Freq: Once | INTRAMUSCULAR | Status: AC
  Administered 2023-02-05: 15 mg via INTRAVENOUS
  Filled 2023-02-05: qty 1

## 2023-02-05 NOTE — ED Provider Notes (Signed)
[place] EMERGENCY DEPARTMENT AT [facility] Provider Note   CSN: [id number] Arrival date & time: 02/05/23  1058     History  Chief Complaint  Patient presents with   Abdominal Pain   Vaginal Bleeding    Maria Cook is a 47 y.o. female.   Abdominal Pain Associated symptoms: vaginal bleeding   Vaginal Bleeding Associated symptoms: abdominal pain      Patient has history of no significant medical problems.  She presents to the ED with complaints of abdominal pain.  Patient states she has been having some pain in her lower abdomen radiating towards her lower back.  Patient states she also started having vaginal bleeding today that is heavier than usual.  Patient does have history of irregular menses.  Patient did have menstrual period last month.  She denies any vomiting.  No blood in her stool.  No fevers or chills.  No dysuria.  Home Medications Prior to Admission medications   Medication Sig Start Date End Date Taking? Authorizing Provider  ibuprofen (ADVIL) 600 MG tablet Take 1 tablet (600 mg total) by mouth every 6 (six) hours as needed. Patient not taking: Reported on 10/10/2022 01/22/21   Dartha Lodge, PA-C  methocarbamol (ROBAXIN) 500 MG tablet Take 1 tablet (500 mg total) by mouth 2 (two) times daily. Patient not taking: Reported on 10/10/2022 01/22/21   Dartha Lodge, PA-C  OMEPRAZOLE PO Take 1 tablet by mouth daily as needed.    [provider]  ondansetron (ZOFRAN-ODT) 4 MG disintegrating tablet Take 1 tablet (4 mg total) by mouth every 8 (eight) hours as needed for nausea or vomiting. 10/10/22   Roxy Horseman, PA-C  oxyCODONE-acetaminophen (PERCOCET) 5-325 MG tablet Take 1-2 tablets by mouth every 6 (six) hours as needed. 10/10/22   Roxy Horseman, PA-C      Allergies    Patient has no known allergies.    Review of Systems   Review of Systems  Gastrointestinal:  Positive for abdominal pain.  Genitourinary:  Positive for vaginal  bleeding.    Physical Exam Updated Vital Signs BP 114/84   Pulse 74   Temp 97.6 F (36.4 C) (Oral)   Resp 18   Ht 1.575 m (5\' 2" )   Wt 70.3 kg   LMP 01/05/2023 (Approximate)   SpO2 100%   BMI 28.35 kg/m  Physical Exam Vitals and nursing note reviewed.  Constitutional:      General: She is not in acute distress.    Appearance: She is well-developed.  HENT:     Head: Normocephalic and atraumatic.     Right Ear: External ear normal.     Left Ear: External ear normal.  Eyes:     General: No scleral icterus.       Right eye: No discharge.        Left eye: No discharge.     Conjunctiva/sclera: Conjunctivae normal.  Neck:     Trachea: No tracheal deviation.  Cardiovascular:     Rate and Rhythm: Normal rate and regular rhythm.  Pulmonary:     Effort: Pulmonary effort is normal. No respiratory distress.     Breath sounds: Normal breath sounds. No stridor. No wheezing or rales.  Abdominal:     General: Bowel sounds are normal. There is no distension.     Palpations: Abdomen is soft.     Tenderness: There is no abdominal tenderness. There is no guarding or rebound.  Genitourinary:    Vagina: No tenderness.  Musculoskeletal:        General: No tenderness or deformity.     Cervical back: Neck supple.  Skin:    General: Skin is warm and dry.     Findings: No rash.  Neurological:     General: No focal deficit present.     Mental Status: She is alert.     Cranial Nerves: No cranial nerve deficit, dysarthria or facial asymmetry.     Sensory: No sensory deficit.     Motor: No abnormal muscle tone or seizure activity.     Coordination: Coordination normal.  Psychiatric:        Mood and Affect: Mood normal.     ED Results / Procedures / Treatments   Labs (all labs ordered are listed, but only abnormal results are displayed) Labs Reviewed  COMPREHENSIVE METABOLIC PANEL - Abnormal; Notable for the following components:      Result Value   Total Protein 8.6 (*)    All  other components within normal limits  LIPASE, BLOOD  CBC  HCG, SERUM, QUALITATIVE  URINALYSIS, ROUTINE W REFLEX MICROSCOPIC    EKG None  Radiology No results found.  Procedures Procedures    Medications Ordered in ED Medications  ketorolac (TORADOL) 15 MG/ML injection 15 mg (15 mg Intravenous Given 02/05/23 1539)    ED Course/ Medical Decision Making/ A&P                                 Medical Decision Making Problems Addressed: Abnormal vaginal bleeding: acute illness or injury that poses a threat to life or bodily functions  Amount and/or Complexity of Data Reviewed Labs: ordered. Decision-making details documented in ED Course.  Risk Prescription drug management.   Patient presented to the ED for vaginal bleeding.  Patient also complaining of lower abdominal pain.  My exam no tenderness to exam.  Abdominal exam benign.  Patient's laboratory tests are reassuring.  Normal CBC and metabolic panel.  Ultrasound ordered by triage provider.  Will proceed with dose of pain medications Toradol.  As long as no acute process noted ultrasound patient would benefit from outpatient OB/GYN follow-up.  Case turned over to Dr. Theresia Lo at shift change        Final Clinical Impression(s) / ED Diagnoses Final diagnoses:  Abnormal vaginal bleeding    Rx / DC Orders ED Discharge Orders     None         Linwood Dibbles, MD 02/05/23 [phone]

## 2023-02-05 NOTE — ED Triage Notes (Signed)
Pt arrive POV reporting abdominal pain, lower that radiates to lower back. Pain started yesterday. Endorses abdominal bleeding that started today. Reports having to change pads hourly. Hx of irregular menstrual, states told she was premenopausal. States she gets it once a year for the past 5 years. Reports having a period last month on the first. Denies dizziness, headache, shob or any other symptoms at this time

## 2023-02-05 NOTE — ED Provider Triage Note (Signed)
Emergency Medicine Provider Triage Evaluation Note  Maria Cook , a 47 y.o. female  was evaluated in triage.  Pt complains of lower abdominal pain and vaginal bleeding.  Pain initially started 5 days ago in the right lower quadrant and has since progressed to the entire lower abdomen.  Patient started having vaginal spotting yesterday but is since progressed to vaginal bleeding.  She denies nausea, vomiting, diarrhea, fever, chills.  Last menstrual period was at the beginning of August.  Patient is normally irregular.  Review of Systems  Positive:  Negative: See above   Physical Exam  Ht 5\' 2"  (1.575 m)   Wt 70.3 kg   LMP 01/05/2023 (Approximate)   BMI 28.35 kg/m  Gen:   Awake, no distress   Resp:  Normal effort  MSK:   Moves extremities without difficulty  Other:  No abdominal tenderness  Medical Decision Making  Medically screening exam initiated at 12:26 PM.  Appropriate orders placed.  Thanya Voeks was informed that the remainder of the evaluation will be completed by another provider, this initial triage assessment does not replace that evaluation, and the importance of remaining in the ED until their evaluation is complete.     Honor Loh [place], [place] 02/05/23 1228

## 2023-02-05 NOTE — Discharge Instructions (Signed)
You are seen in the emergency department for your abdominal pain.  Your ultrasound showed that you have a cyst which appears to be a hemorrhagic cyst that can occur during her menstrual cycles and these can rupture and bleed which can cause pain.  It should resolve on its own.  You can take Tylenol and Motrin every 6 hours as needed for pain.  You should follow-up with your GYN to have your symptoms rechecked and for repeat ultrasound to make sure this is resolved.  You should return to the emergency department for significantly worsening pain, repetitive vomiting, fevers or any other new or concerning symptoms.  Lo atendieron en el departamento de emergencias por su dolor abdominal.  Su ultrasonido mostr que tiene un quiste que parece ser un quiste hemorrgico que puede ocurrir durante sus ciclos menstruales y estos pueden romperse y Geophysicist/field seismologist, lo que puede Programmer, multimedia.  Debera resolverse por s solo.  Puede tomar Tylenol y Motrin cada 6 horas segn sea necesario para Chief Technology Officer.  Debe hacer un seguimiento con su gineclogo para que vuelva a controlar sus sntomas y repetir la ecografa para asegurarse de que esto se resuelva.  Debe regresar al departamento de emergencias si el dolor empeora significativamente, vmitos repetitivos, fiebre o cualquier otro sntoma nuevo o preocupante.

## 2023-02-05 NOTE — ED Provider Notes (Addendum)
Patient signed out to me at 1530 by Dr. Lynelle Doctor pending urine and ultrasound read.  In short this is a 47 year old female that presented to the emergency department with lower abdominal pain, currently on her menstrual cycle.  On her arrival she was hemodynamically stable in no acute distress, no significant appreciable tenderness.  The patient had labs performed that were within normal range and was given Toradol for pain.  Patient's urine does have hematuria and is otherwise normal which is likely consistent as she is on her menstrual cycle.  Ultrasound did show a 3 cm ovarian cyst is likely hemorrhagic cyst, no evidence of torsion.  On my evaluation, the patient's abdomen is soft and nontender and she reports her pain has completely resolved.  Patient is stable for discharge home with outpatient GYN follow-up has been strict return precautions.   Spanish interpreter ID number [phone] was used for this encounter    Rexford Maus, DO 02/05/23 1710
